# Patient Record
Sex: Male | Born: 1965 | Race: White | Hispanic: No | Marital: Married | Smoking: Never smoker
Health system: Southern US, Community
[De-identification: ages and names within clinical notes are randomized; demographics above are authoritative.]

## PROBLEM LIST (undated history)

## (undated) DIAGNOSIS — R51 Headache: Secondary | ICD-10-CM

## (undated) DIAGNOSIS — Z87442 Personal history of urinary calculi: Secondary | ICD-10-CM

## (undated) DIAGNOSIS — R519 Headache, unspecified: Secondary | ICD-10-CM

## (undated) DIAGNOSIS — G8929 Other chronic pain: Secondary | ICD-10-CM

## (undated) DIAGNOSIS — G4730 Sleep apnea, unspecified: Secondary | ICD-10-CM

## (undated) DIAGNOSIS — E7800 Pure hypercholesterolemia, unspecified: Secondary | ICD-10-CM

## (undated) DIAGNOSIS — I10 Essential (primary) hypertension: Secondary | ICD-10-CM

## (undated) DIAGNOSIS — E119 Type 2 diabetes mellitus without complications: Secondary | ICD-10-CM

## (undated) HISTORY — PX: WISDOM TOOTH EXTRACTION: SHX21

---

## 1997-10-11 ENCOUNTER — Emergency Department (HOSPITAL_COMMUNITY): Admission: EM | Admit: 1997-10-11 | Discharge: 1997-10-12 | Payer: Self-pay

## 1999-10-15 ENCOUNTER — Emergency Department (HOSPITAL_COMMUNITY): Admission: EM | Admit: 1999-10-15 | Discharge: 1999-10-15 | Payer: Self-pay | Admitting: Emergency Medicine

## 1999-10-15 ENCOUNTER — Encounter: Payer: Self-pay | Admitting: Emergency Medicine

## 2000-09-20 ENCOUNTER — Encounter: Admission: RE | Admit: 2000-09-20 | Discharge: 2000-09-20 | Payer: Self-pay | Admitting: Internal Medicine

## 2000-09-20 ENCOUNTER — Encounter: Payer: Self-pay | Admitting: Internal Medicine

## 2002-10-02 ENCOUNTER — Encounter: Admission: RE | Admit: 2002-10-02 | Discharge: 2002-10-02 | Payer: Self-pay | Admitting: Internal Medicine

## 2002-10-02 ENCOUNTER — Encounter: Payer: Self-pay | Admitting: Internal Medicine

## 2004-01-14 ENCOUNTER — Emergency Department (HOSPITAL_COMMUNITY): Admission: EM | Admit: 2004-01-14 | Discharge: 2004-01-14 | Payer: Self-pay | Admitting: Emergency Medicine

## 2004-05-08 ENCOUNTER — Encounter: Admission: RE | Admit: 2004-05-08 | Discharge: 2004-05-08 | Payer: Self-pay | Admitting: Orthopedic Surgery

## 2005-02-09 ENCOUNTER — Encounter: Admission: RE | Admit: 2005-02-09 | Discharge: 2005-02-09 | Payer: Self-pay | Admitting: Internal Medicine

## 2005-03-01 ENCOUNTER — Encounter: Admission: RE | Admit: 2005-03-01 | Discharge: 2005-03-01 | Payer: Self-pay | Admitting: Gastroenterology

## 2010-05-06 ENCOUNTER — Encounter: Payer: Self-pay | Admitting: Gastroenterology

## 2013-05-08 ENCOUNTER — Ambulatory Visit: Payer: Self-pay | Admitting: Interventional Cardiology

## 2013-05-20 ENCOUNTER — Ambulatory Visit: Payer: Self-pay | Admitting: Interventional Cardiology

## 2015-07-25 ENCOUNTER — Ambulatory Visit (INDEPENDENT_AMBULATORY_CARE_PROVIDER_SITE_OTHER): Payer: Managed Care, Other (non HMO) | Admitting: Podiatry

## 2015-07-25 ENCOUNTER — Encounter: Payer: Self-pay | Admitting: Podiatry

## 2015-07-25 VITALS — BP 160/91 | HR 67 | Temp 98.7°F | Resp 18

## 2015-07-25 DIAGNOSIS — L03115 Cellulitis of right lower limb: Secondary | ICD-10-CM

## 2015-07-25 DIAGNOSIS — R238 Other skin changes: Secondary | ICD-10-CM | POA: Insufficient documentation

## 2015-07-25 DIAGNOSIS — L988 Other specified disorders of the skin and subcutaneous tissue: Secondary | ICD-10-CM | POA: Diagnosis not present

## 2015-07-25 NOTE — Progress Notes (Signed)
   Subjective:    Patient ID: Nathaniel Conley, male    DOB: July 18, 1965, 50 y.o.   MRN: [id number]  HPI  50 year old male presents the also concerns of the blister to the bottom of his right foot on the ball of the big toe. He states the last Wednesday he started to have the blisters was not feeling well any but his primary care doctor on Friday he was given Augmentin. He states that yesterday blister did pop and had some bloody drainage In the area. Since that blister pop the pain has decreased. He states that this small amount of redness directly around the blister but this has improved since starting antibodies. Denies any red streaks. No other complaints at this time. He is diabetic and states his A1c went between an 8-9.   Review of Systems  All other systems reviewed and are negative.      Objective:   Physical Exam General: AAO x3, NAD  Dermatological: On the plantar aspect of the right foot submetatarsal one extending to the first interspace is a flaccid bulla with currently no fluid within it. Upon debridement superficial granular wound present. There is a faint rim of erythema around the area without any ascending cellulitis. There is no drainage or pus. No fluctuance or crepitus. No malodor. No other open lesions or pre-ulcerative lesions identified bilaterally.  Vascular: Dorsalis Pedis artery and Posterior Tibial artery pedal pulses are 2/4 bilateral with immedate capillary fill time. Pedal hair growth present. No varicosities and no lower extremity edema present bilateral. There is no pain with calf compression, swelling, warmth, erythema.   Neruologic: Grossly intact via light touch bilateral. Vibratory intact via tuning fork bilateral. Protective threshold with Semmes Wienstein monofilament intact to all pedal sites bilateral. Patellar and Achilles deep tendon reflexes 2+ bilateral. No Babinski or clonus noted bilateral.   Musculoskeletal: No gross boney pedal deformities  bilateral. No pain, crepitus, or limitation noted with foot and ankle range of motion bilateral. Muscular strength 5/5 in all groups tested bilateral.  Gait: Unassisted, Nonantalgic.       Assessment & Plan:  50 year old male right foot bulla with apparently resolving cellulitis -Treatment options discussed including all alternatives, risks, and complications -Etiology of symptoms were discussed -I went ahead and debrided some of the epidermolysis to reveal underlying greater wound. Recommended antibiotic  ointment and nonadherent dressing daily. -Finish course of Augmentin. -Will hold off on blood work as the infection appears resolving. -Monitor for any clinical signs or symptoms of infection and directed to call the office immediately should any occur or go to the ER. -Follow-up in 1 week or sooner if any problems arise. In the meantime, encouraged to call the office with any questions, concerns, change in symptoms.   Ovid Curd, DPM

## 2015-08-01 ENCOUNTER — Ambulatory Visit (INDEPENDENT_AMBULATORY_CARE_PROVIDER_SITE_OTHER): Payer: Managed Care, Other (non HMO) | Admitting: Podiatry

## 2015-08-01 DIAGNOSIS — L03115 Cellulitis of right lower limb: Secondary | ICD-10-CM | POA: Diagnosis not present

## 2015-08-01 DIAGNOSIS — L988 Other specified disorders of the skin and subcutaneous tissue: Secondary | ICD-10-CM | POA: Diagnosis not present

## 2015-08-01 DIAGNOSIS — R238 Other skin changes: Secondary | ICD-10-CM

## 2015-08-02 NOTE — Progress Notes (Signed)
Subjective:     Patient ID: Nathaniel Conley, male   DOB: 1965-11-25, 50 y.o.   MRN: [id number]  HPI patient states I'm doing well with my right foot with the site healing well with no blistering noted currently and no drainage noted by patient   Review of Systems     Objective:   Physical Exam Neurovascular status unchanged with muscle strength adequate and patient found to have discoloration around the right first metatarsal but no active drainage or breakdown of tissue    Assessment:     Blistering which is localized and is no longer active    Plan:     Advised on physical therapy supportive shoe gear and soaks and if any drainage were to occur to reappoint but it appears that this will heal uneventfully

## 2016-02-09 ENCOUNTER — Other Ambulatory Visit: Payer: Self-pay | Admitting: Internal Medicine

## 2016-02-09 DIAGNOSIS — R51 Headache: Principal | ICD-10-CM

## 2016-02-09 DIAGNOSIS — R519 Headache, unspecified: Secondary | ICD-10-CM

## 2016-02-19 ENCOUNTER — Ambulatory Visit
Admission: RE | Admit: 2016-02-19 | Discharge: 2016-02-19 | Disposition: A | Discharge status: Home / Self Care | Payer: Managed Care, Other (non HMO) | Source: Ambulatory Visit | Attending: Internal Medicine | Admitting: Internal Medicine

## 2016-02-19 DIAGNOSIS — R519 Headache, unspecified: Secondary | ICD-10-CM

## 2016-02-19 DIAGNOSIS — R51 Headache: Principal | ICD-10-CM

## 2016-02-19 MED ORDER — GADOBENATE DIMEGLUMINE 529 MG/ML IV SOLN
20.0000 mL | Freq: Once | INTRAVENOUS | Status: AC | PRN
  Administered 2016-02-19: 20 mL via INTRAVENOUS

## 2017-04-12 IMAGING — MR MR HEAD WO/W CM
10 of 12 series · 36 of 48 positions shown · IV contrast (20ml Multihance)
Comparison: None.

CLINICAL DATA: Constant headaches for 3 months.  Balance issue.

Creatinine was obtained on site at [HOSPITAL] at [HOSPITAL].
Results: Creatinine 1.0 mg/dL.
EXAM:
MRI HEAD WITHOUT AND WITH CONTRAST
TECHNIQUE: Multiplanar, multiecho pulse sequences of the brain and surrounding
structures were obtained without and with intravenous contrast.
CONTRAST:  20mL MULTIHANCE GADOBENATE DIMEGLUMINE 529 MG/ML IV SOLN

[Series 2: T1 · sagittal · 5.0mm · 0.45mm/px · 2 of 19 slices shown]
[im 1/19]
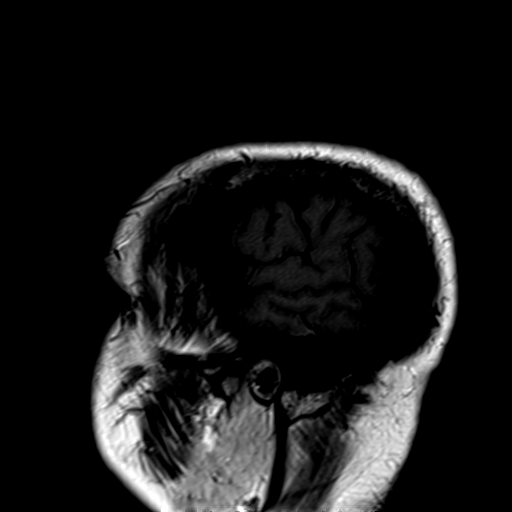
[im 19/19]
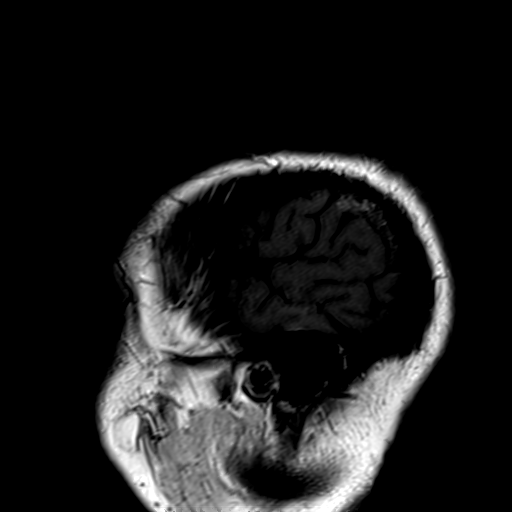

[Series 3: T2 · axial · 5.0mm · 0.30mm/px · z∈[-57,+86]mm · 2 of 24 slices shown (1 of 2)]
[im 1/24]
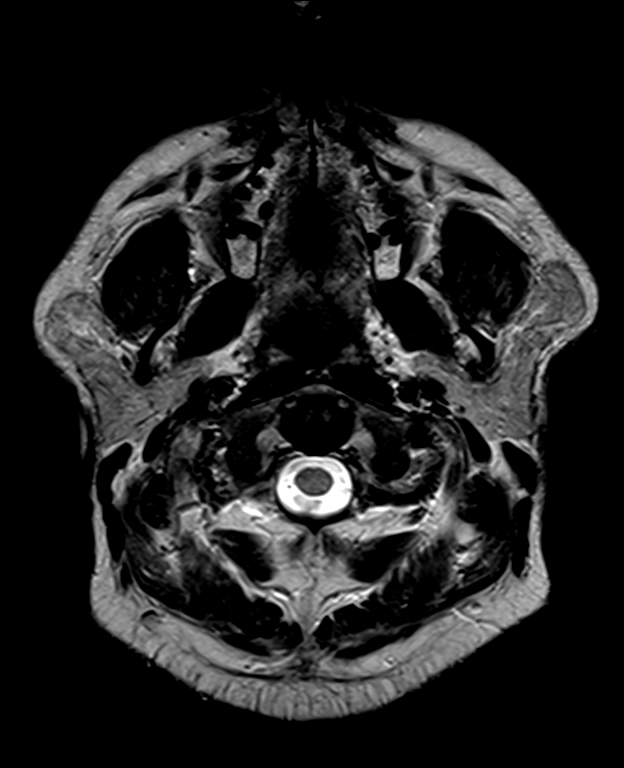
[im 24/24]
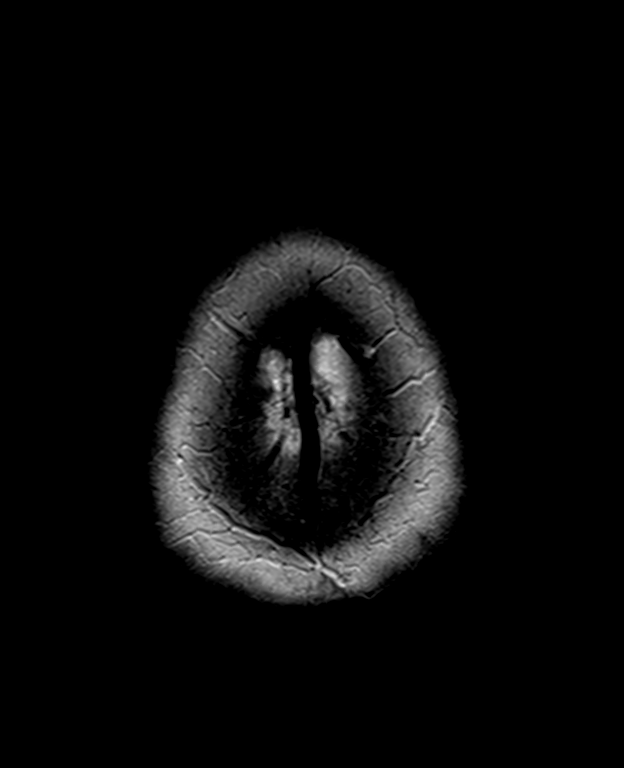

[Series 4: DWI · axial · 3.0mm · 1.80mm/px · z∈[-52,+83]mm · 8 of 95 slices shown (1 of 4)]
[im 1/95]
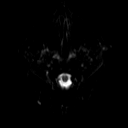
[im 14/95]
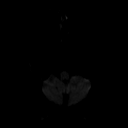
[im 27/95]
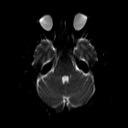
[im 41/95]
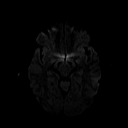
[im 54/95]
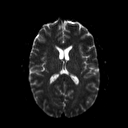
[im 68/95]
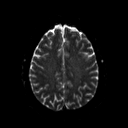
[im 81/95]
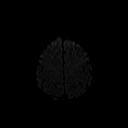
[im 95/95]
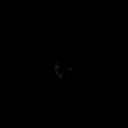

[Series 5: DWI · axial · 3.0mm · 1.80mm/px · z∈[-52,+83]mm · 4 of 48 slices shown (2 of 4)]
[im 1/48]
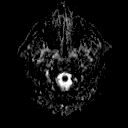
[im 16/48]
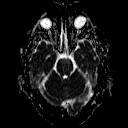
[im 32/48]
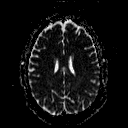
[im 48/48]
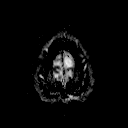

[Series 6: DWI · coronal · 5.0mm · 1.80mm/px · 5 of 68 slices shown (3 of 4)]
[im 1/68]
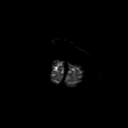
[im 17/68]
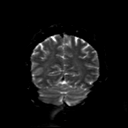
[im 34/68]
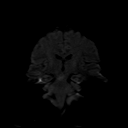
[im 51/68]
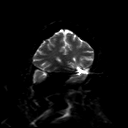
[im 68/68]
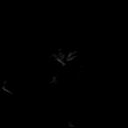

[Series 7: DWI · coronal · 5.0mm · 1.80mm/px · 3 of 34 slices shown (4 of 4)]
[im 1/34]
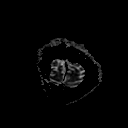
[im 17/34]
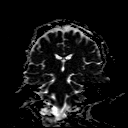
[im 34/34]
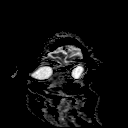

[Series 8: FLAIR · axial · 5.0mm · 0.45mm/px · z∈[-57,+86]mm · 2 of 24 slices shown]
[im 1/24]
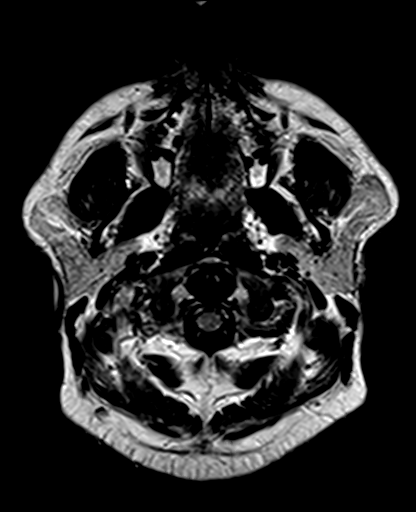
[im 24/24]
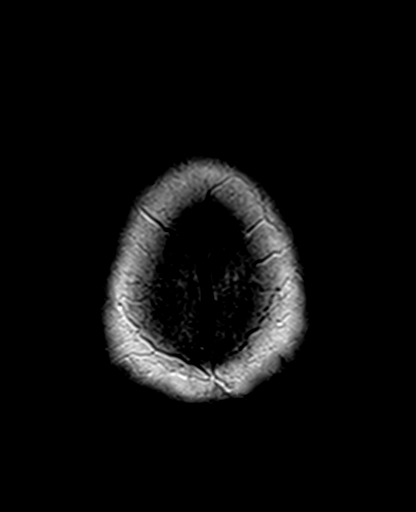

[Series 11: swi_images · axial · 2.0mm · 0.90mm/px · z∈[-62,+90]mm · 6 of 80 slices shown]
[im 1/80]
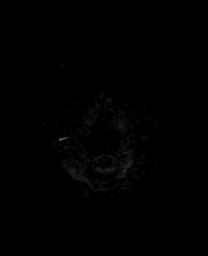
[im 16/80]
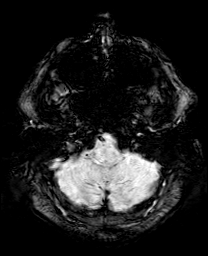
[im 32/80]
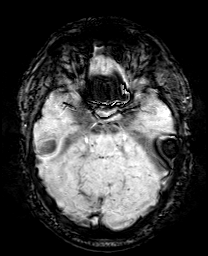
[im 48/80]
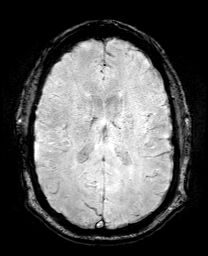
[im 64/80]
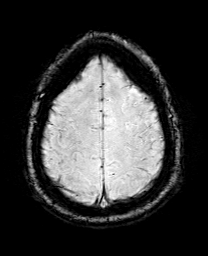
[im 80/80]
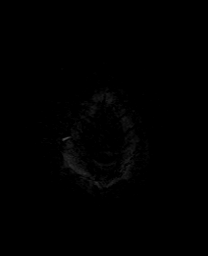

[Series 12: T2 · coronal · 5.0mm · 0.45mm/px · 2 of 24 slices shown (2 of 2)]
[im 1/24]
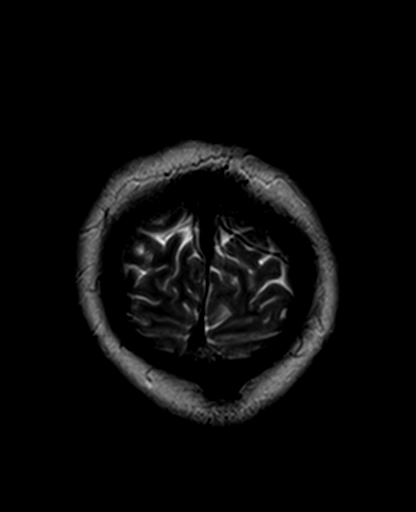
[im 24/24]
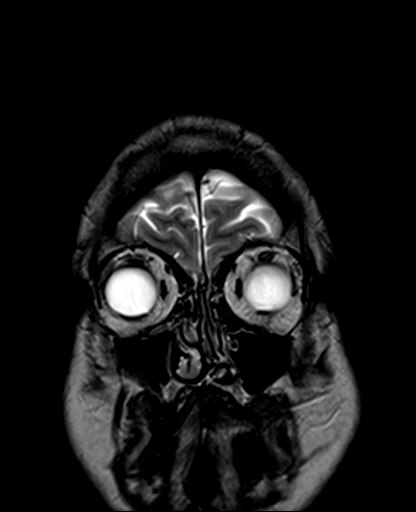

[Series 14: T1 post-contrast · coronal · 5.0mm · 0.43mm/px · 2 of 24 slices shown]
[im 1/24]
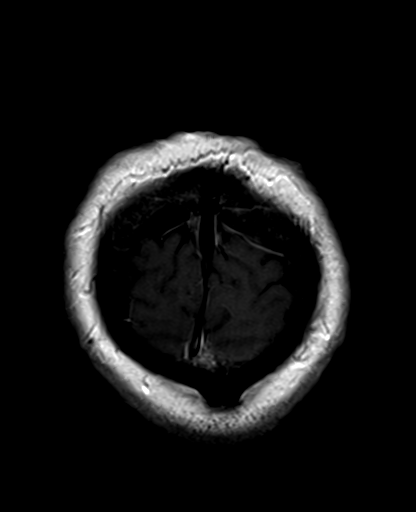
[im 24/24]
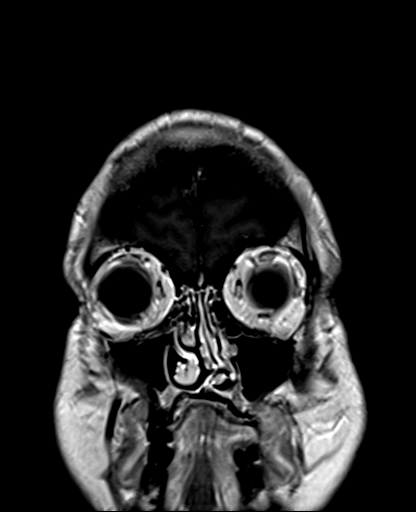

[36 of 48 positions shown; findings below may reference images not displayed]

FINDINGS: Brain: There is no evidence of acute infarct, intracranial
hemorrhage, mass, midline shift, or extra-axial fluid collection.
The ventricles and sulci are normal. The brain is normal in signal.
No abnormal enhancement is identified.

Vascular: Major intracranial vascular flow voids are preserved, with
the left vertebral artery being dominant.

Skull and upper cervical spine: Unremarkable bone marrow signal.

Sinuses/Orbits: Unremarkable orbits. No significant inflammatory
sinus disease.

Other: None.
IMPRESSION: Unremarkable brain MRI.

## 2018-01-21 ENCOUNTER — Ambulatory Visit
Admission: RE | Admit: 2018-01-21 | Discharge: 2018-01-21 | Disposition: A | Discharge status: Home / Self Care | Payer: Managed Care, Other (non HMO) | Source: Ambulatory Visit | Attending: Internal Medicine | Admitting: Internal Medicine

## 2018-01-21 ENCOUNTER — Other Ambulatory Visit: Payer: Self-pay | Admitting: Internal Medicine

## 2018-01-21 DIAGNOSIS — R1031 Right lower quadrant pain: Secondary | ICD-10-CM

## 2018-01-23 ENCOUNTER — Encounter (HOSPITAL_COMMUNITY): Payer: Self-pay | Admitting: General Practice

## 2018-01-24 ENCOUNTER — Other Ambulatory Visit: Payer: Self-pay | Admitting: Urology

## 2018-01-26 NOTE — Discharge Instructions (Signed)
Lithotripsy, Care After °This sheet gives you information about how to care for yourself after your procedure. Your health care provider may also give you more specific instructions. If you have problems or questions, contact your health care provider. °What can I expect after the procedure? °After the procedure, it is common to have: °· Some blood in your urine. This should only last for a few days. °· Soreness in your back, sides, or upper abdomen for a few days. °· Blotches or bruises on your back where the pressure wave entered the skin. °· Pain, discomfort, or nausea when pieces (fragments) of the kidney stone move through the tube that carries urine from the kidney to the bladder (ureter). Stone fragments may pass soon after the procedure, but they may continue to pass for up to 4-8 weeks. °? If you have severe pain or nausea, contact your health care provider. This may be caused by a large stone that was not broken up, and this may mean that you need more treatment. °· Some pain or discomfort during urination. °· Some pain or discomfort in the lower abdomen or (in men) at the base of the penis. ° °Follow these instructions at home: °Medicines °· Take over-the-counter and prescription medicines only as told by your health care provider. °· If you were prescribed an antibiotic medicine, take it as told by your health care provider. Do not stop taking the antibiotic even if you start to feel better. °· Do not drive for 24 hours if you were given a medicine to help you relax (sedative). °· Do not drive or use heavy machinery while taking prescription pain medicine. °Eating and drinking °· Drink enough water and fluids to keep your urine clear or pale yellow. This helps any remaining pieces of the stone to pass. It can also help prevent new stones from forming. °· Eat plenty of fresh fruits and vegetables. °· Follow instructions from your health care provider about eating and drinking restrictions. You may be  instructed: °? To reduce how much salt (sodium) you eat or drink. Check ingredients and nutrition facts on packaged foods and beverages. °? To reduce how much meat you eat. °· Eat the recommended amount of calcium for your age and gender. Ask your health care provider how much calcium you should have. °General instructions °· Get plenty of rest. °· Most people can resume normal activities 1-2 days after the procedure. Ask your health care provider what activities are safe for you. °· If directed, strain all urine through the strainer that was provided by your health care provider. °? Keep all fragments for your health care provider to see. Any stones that are found may be sent to a medical lab for examination. The stone may be as small as a grain of salt. °· Keep all follow-up visits as told by your health care provider. This is important. °Contact a health care provider if: °· You have pain that is severe or does not get better with medicine. °· You have nausea that is severe or does not go away. °· You have blood in your urine longer than your health care provider told you to expect. °· You have more blood in your urine. °· You have pain during urination that does not go away. °· You urinate more frequently than usual and this does not go away. °· You develop a rash or any other possible signs of an allergic reaction. °Get help right away if: °· You have severe pain in   your back, sides, or upper abdomen. °· You have severe pain while urinating. °· Your urine is very dark red. °· You have blood in your stool (feces). °· You cannot pass any urine at all. °· You feel a strong urge to urinate after emptying your bladder. °· You have a fever or chills. °· You develop shortness of breath, difficulty breathing, or chest pain. °· You have severe nausea that leads to persistent vomiting. °· You faint. °Summary °· After this procedure, it is common to have some pain, discomfort, or nausea when pieces (fragments) of the  kidney stone move through the tube that carries urine from the kidney to the bladder (ureter). If this pain or nausea is severe, however, you should contact your health care provider. °· Most people can resume normal activities 1-2 days after the procedure. Ask your health care provider what activities are safe for you. °· Drink enough water and fluids to keep your urine clear or pale yellow. This helps any remaining pieces of the stone to pass, and it can help prevent new stones from forming. °· If directed, strain your urine and keep all fragments for your health care provider to see. Fragments or stones may be as small as a grain of salt. °· Get help right away if you have severe pain in your back, sides, or upper abdomen or have severe pain while urinating. °This information is not intended to replace advice given to you by your health care provider. Make sure you discuss any questions you have with your health care provider. °Document Released: 04/22/2007 Document Revised: 02/22/2016 Document Reviewed: 02/22/2016 °Elsevier Interactive Patient Education © 2018 [employer] ° °

## 2018-01-26 NOTE — H&P (Signed)
HPI: Nathaniel Conley is a 52 year-old male with a right distal ureteral stone.  The problem is on the right side. He first stated noticing pain on approximately 01/19/2018. This is his first kidney stone. He is currently having flank pain and back pain. He denies having groin pain, nausea, vomiting, fever, and chills. He has not caught a stone in his urine strainer since his symptoms began.   He has never had surgical treatment for calculi in the past.   Patient was seen yesterday by Dr. Valentina Lucks for right-sided flank pain. He underwent a CT of the abdomen and pelvis that revealed a 7 mm mid to distal right ureteral calculus. He denies any fever or chill. He was given Toradol yesterday and his pain improved. Pain is currently controlled. No other complaints today.     ALLERGIES: None   MEDICATIONS: Lisinopril  Metformin Hcl  Atorvastatin Calcium  Glimepiride     GU PSH: None   NON-GU PSH: None   GU PMH: Renal calculus    NON-GU PMH: Diabetes Type 2 GERD Gout Hypercholesterolemia Hypertension Sleep Apnea    FAMILY HISTORY: 2 daughters - Other Colon Cancer - Father stroke - Mother   SOCIAL HISTORY: Marital Status: Married Preferred Language: English; Race: White Current Smoking Status: Patient has never smoked.   Tobacco Use Assessment Completed: Used Tobacco in last 30 days? Drinks 2 caffeinated drinks per day.    REVIEW OF SYSTEMS:    GU Review Male:   Patient denies frequent urination, hard to postpone urination, burning/ pain with urination, get up at night to urinate, leakage of urine, stream starts and stops, trouble starting your stream, have to strain to urinate , erection problems, and penile pain.  Gastrointestinal (Upper):   Patient denies nausea, vomiting, and indigestion/ heartburn.  Gastrointestinal (Lower):   Patient denies diarrhea and constipation.  Constitutional:   Patient reports weight loss. Patient denies fever, night sweats, and fatigue.  Skin:    Patient denies skin rash/ lesion and itching.  Eyes:   Patient denies blurred vision and double vision.  Ears/ Nose/ Throat:   Patient denies sore throat and sinus problems.  Hematologic/Lymphatic:   Patient denies swollen glands and easy bruising.  Cardiovascular:   Patient denies leg swelling and chest pains.  Respiratory:   Patient denies cough and shortness of breath.  Endocrine:   Patient denies excessive thirst.  Musculoskeletal:   Patient denies back pain and joint pain.  Neurological:   Patient reports headaches. Patient denies dizziness.  Psychologic:   Patient denies depression and anxiety.   VITAL SIGNS:    Weight 209 lb / 94.8 kg  Height 69 in / 175.26 cm  BP 146/83 mmHg  Heart Rate 63 /min  Temperature 99.0 F / 37.2 C  BMI 30.9 kg/m   MULTI-SYSTEM PHYSICAL EXAMINATION:    Constitutional: Well-nourished. No physical deformities. Normally developed. Good grooming.  Respiratory: No labored breathing, no use of accessory muscles.   Cardiovascular: Normal temperature, adequate perfusion of extremities  Skin: No paleness, no jaundice  Neurologic / Psychiatric: Oriented to time, oriented to place, oriented to person. No depression, no anxiety, no agitation.  Gastrointestinal: No mass, no tenderness, no rigidity, non obese abdomen.  Eyes: Normal conjunctivae. Normal eyelids.  Musculoskeletal: Normal gait and station of head and neck.     PAST DATA REVIEWED:  Source Of History:  Patient  X-Ray Review: C.T. Abdomen/Pelvis: Reviewed Films. Reviewed Report. Discussed With Patient.     PROCEDURES:  KUB - [id number]  A single view of the abdomen is obtained.  7 mm distal ureteral calculus redemonstrated               Urinalysis w/Scope Dipstick Dipstick Cont'd Micro  Color: Yellow Bilirubin: Neg mg/dL WBC/hpf: 10 - 54/UJW  Appearance: Clear Ketones: Neg mg/dL RBC/hpf: 0 - 2/hpf  Specific Gravity: 1.025 Blood: Trace ery/uL Bacteria: NS (Not Seen)  pH: <=5.0 Protein:  Trace mg/dL Cystals: NS (Not Seen)  Glucose: Neg mg/dL Urobilinogen: 0.2 mg/dL Casts: Hyaline    Nitrites: Neg Trichomonas: Not Present    Leukocyte Esterase: Neg leu/uL Mucous: Present      Epithelial Cells: NS (Not Seen)      Yeast: NS (Not Seen)      Sperm: Not Present       Medications New Meds: Tamsulosin Hcl 0.4 mg capsule 1 capsule PO Daily   #14  1 Refill(s)  Hydrocodone-Acetaminophen 5 mg-325 mg tablet 1 tablet PO Q 6 H PRN For pain  #10  0 Refill(s)  ASSESSMENT:      ICD-10 Details  1 GU:   Ureteral calculus - N20.1   2   Ureteral obstruction secondary to calculous - N13.2    PLAN: We discussed the management of urinary stones. These options include observation, ureteroscopy, and shockwave lithotripsy. We discussed which options are relevant to these particular stones. We discussed the natural history of stones as well as the complications of untreated stones and the impact on quality of life without treatment as well as with each of the above listed treatments. We also discussed the efficacy of each treatment in its ability to clear the stone burden. With any of these management options I discussed the signs and symptoms of infection and the need for emergent treatment should these be experienced. For each option we discussed the ability of each procedure to clear the patient of their stone burden.   For observation I described the risks which include but are not limited to silent renal damage, life-threatening infection, need for emergent surgery, failure to pass stone, and pain.   For ureteroscopy I described the risks which include heart attack, stroke, pulmonary embolus, death, bleeding, infection, damage to contiguous structures, positioning injury, ureteral stricture, ureteral avulsion, ureteral injury, need for ureteral stent, inability to perform ureteroscopy, need for an interval procedure, inability to clear stone burden, stent discomfort and pain.   For shockwave  lithotripsy I described the risks which include arrhythmia, kidney contusion, kidney hemorrhage, need for transfusion, pain, inability to break up stone, inability to pass stone fragments, Steinstrasse, infection associated with obstructing stones, need for different surgical procedure, need for repeat shockwave lithotripsy.   Patient would like to proceed with ESWL. He understands that there is potential for failure especially in its current position but would like to try this as a first line treatment.

## 2018-01-27 ENCOUNTER — Encounter (HOSPITAL_COMMUNITY): Payer: Self-pay | Admitting: *Deleted

## 2018-01-27 ENCOUNTER — Ambulatory Visit (HOSPITAL_COMMUNITY): Payer: Managed Care, Other (non HMO)

## 2018-01-27 ENCOUNTER — Ambulatory Visit (HOSPITAL_COMMUNITY)
Admission: RE | Admit: 2018-01-27 | Discharge: 2018-01-27 | Disposition: A | Discharge status: Home / Self Care | Payer: Managed Care, Other (non HMO) | Source: Ambulatory Visit | Attending: Urology | Admitting: Urology

## 2018-01-27 ENCOUNTER — Other Ambulatory Visit: Payer: Self-pay

## 2018-01-27 ENCOUNTER — Encounter (HOSPITAL_COMMUNITY)
Admission: RE | Disposition: A | Discharge status: Home / Self Care | Payer: Self-pay | Source: Ambulatory Visit | Attending: Urology

## 2018-01-27 DIAGNOSIS — E7800 Pure hypercholesterolemia, unspecified: Secondary | ICD-10-CM | POA: Diagnosis not present

## 2018-01-27 DIAGNOSIS — G4733 Obstructive sleep apnea (adult) (pediatric): Secondary | ICD-10-CM | POA: Insufficient documentation

## 2018-01-27 DIAGNOSIS — K219 Gastro-esophageal reflux disease without esophagitis: Secondary | ICD-10-CM | POA: Diagnosis not present

## 2018-01-27 DIAGNOSIS — E119 Type 2 diabetes mellitus without complications: Secondary | ICD-10-CM | POA: Insufficient documentation

## 2018-01-27 DIAGNOSIS — Z79899 Other long term (current) drug therapy: Secondary | ICD-10-CM | POA: Insufficient documentation

## 2018-01-27 DIAGNOSIS — I10 Essential (primary) hypertension: Secondary | ICD-10-CM | POA: Diagnosis not present

## 2018-01-27 DIAGNOSIS — N201 Calculus of ureter: Secondary | ICD-10-CM | POA: Diagnosis not present

## 2018-01-27 DIAGNOSIS — M109 Gout, unspecified: Secondary | ICD-10-CM | POA: Diagnosis not present

## 2018-01-27 DIAGNOSIS — N135 Crossing vessel and stricture of ureter without hydronephrosis: Secondary | ICD-10-CM | POA: Diagnosis not present

## 2018-01-27 DIAGNOSIS — Z7984 Long term (current) use of oral hypoglycemic drugs: Secondary | ICD-10-CM | POA: Insufficient documentation

## 2018-01-27 HISTORY — DX: Headache, unspecified: R51.9

## 2018-01-27 HISTORY — DX: Essential (primary) hypertension: I10

## 2018-01-27 HISTORY — DX: Headache: R51

## 2018-01-27 HISTORY — DX: Other chronic pain: G89.29

## 2018-01-27 HISTORY — DX: Pure hypercholesterolemia, unspecified: E78.00

## 2018-01-27 HISTORY — PX: EXTRACORPOREAL SHOCK WAVE LITHOTRIPSY: SHX1557

## 2018-01-27 HISTORY — DX: Sleep apnea, unspecified: G47.30

## 2018-01-27 HISTORY — DX: Personal history of urinary calculi: Z87.442

## 2018-01-27 HISTORY — DX: Type 2 diabetes mellitus without complications: E11.9

## 2018-01-27 LAB — GLUCOSE, CAPILLARY: Glucose-Capillary: 107 mg/dL — ABNORMAL HIGH (ref 70–99)

## 2018-01-27 SURGERY — LITHOTRIPSY, ESWL
Anesthesia: LOCAL | Laterality: Right

## 2018-01-27 MED ORDER — TAMSULOSIN HCL 0.4 MG PO CAPS: 0.4000 mg | ORAL_CAPSULE | ORAL | 0 refills | Status: DC

## 2018-01-27 MED ORDER — DIAZEPAM 5 MG PO TABS
10.0000 mg | ORAL_TABLET | ORAL | Status: AC
  Administered 2018-01-27: 10 mg via ORAL
  Filled 2018-01-27: qty 2

## 2018-01-27 MED ORDER — OXYCODONE HCL 10 MG PO TABS: 10.0000 mg | ORAL_TABLET | ORAL | 0 refills | Status: DC | PRN

## 2018-01-27 MED ORDER — DIPHENHYDRAMINE HCL 25 MG PO CAPS
25.0000 mg | ORAL_CAPSULE | ORAL | Status: AC
  Administered 2018-01-27: 25 mg via ORAL
  Filled 2018-01-27: qty 1

## 2018-01-27 MED ORDER — CIPROFLOXACIN HCL 500 MG PO TABS
500.0000 mg | ORAL_TABLET | ORAL | Status: AC
  Administered 2018-01-27: 500 mg via ORAL
  Filled 2018-01-27: qty 1

## 2018-01-27 MED ORDER — SODIUM CHLORIDE 0.9 % IV SOLN
INTRAVENOUS | Status: DC
  Administered 2018-01-27: 14:00:00 via INTRAVENOUS

## 2018-01-27 NOTE — Progress Notes (Signed)
Patient informed of procedure delay (litho truck has not arrived). Procedure to scheduled for 1530. Stated he will notify his wife. Offered to let patient leave and come back. Nathaniel Conley opted to stay.

## 2018-01-27 NOTE — Op Note (Signed)
See [facility] OP note scanned into chart. Also because of the size, density, location and other factors that cannot be anticipated I feel this will likely be a staged procedure. This fact supersedes any indication in the scanned [facility] operative note to the contrary.  

## 2018-01-28 ENCOUNTER — Encounter (HOSPITAL_COMMUNITY): Payer: Self-pay | Admitting: Urology

## 2020-05-25 ENCOUNTER — Other Ambulatory Visit: Payer: Self-pay | Admitting: Internal Medicine

## 2020-05-25 DIAGNOSIS — Z8249 Family history of ischemic heart disease and other diseases of the circulatory system: Secondary | ICD-10-CM

## 2020-06-17 ENCOUNTER — Other Ambulatory Visit: Payer: Self-pay

## 2020-06-17 ENCOUNTER — Ambulatory Visit
Admission: RE | Admit: 2020-06-17 | Discharge: 2020-06-17 | Disposition: A | Discharge status: Home / Self Care | Payer: No Typology Code available for payment source | Source: Ambulatory Visit | Attending: Internal Medicine | Admitting: Internal Medicine

## 2020-06-17 DIAGNOSIS — Z8249 Family history of ischemic heart disease and other diseases of the circulatory system: Secondary | ICD-10-CM

## 2020-07-04 NOTE — Progress Notes (Signed)
Cardiology Office Note:    Date:  07/06/2020   ID:  Nathaniel Conley, DOB 02-Sep-1965, MRN [id number]  PCP:  Kirby Funk, MD  Cardiologist:  No primary care provider on file.  Electrophysiologist:  None   Referring MD: Kirby Funk, MD   Chief Complaint  Patient presents with  . Coronary Artery Disease    History of Present Illness:    Nathaniel Conley is a 55 y.o. male with a hx of T2DM, hyperlipidemia, hypertension, OSA who is referred by Dr. Valentina Lucks for evaluation of CAD.  Calcium score on 06/17/2020 was 963 (98th percentile).  Also noted to have dilated ascending aorta measuring 40 mm.  He does report occasional chest pain.  Describes as dull aching pain in center of his chest, occurs when he is under significant stress.  Occurs about once per month.  Has not noted any exertional chest pain.  He denies any dyspnea.  Reports chest pain typically last for 5 to 10 minutes.  Has had some lightheadedness recently with dietary changes, denies any syncope.  Has chronic headaches.  He denies any palpitations or lower extremity edema.  Does not exercise, has been limited by hip pain.  No smoking history.  Family history includes paternal grandfather died of MI at age 57 and father had MI at age 37.  Past Medical History:  Diagnosis Date  . Chronic headaches    " 24-7" headache Pt. stated.  . Diabetes mellitus without complication (HCC)   . High cholesterol   . History of kidney stones   . Hypertension   . Sleep apnea    uses bipap    Past Surgical History:  Procedure Laterality Date  . EXTRACORPOREAL SHOCK WAVE LITHOTRIPSY Right 01/27/2018   Procedure: RIGHT EXTRACORPOREAL SHOCK WAVE LITHOTRIPSY (ESWL);  Surgeon: Ihor Gully, MD;  Location: [facility];  Service: Urology;  Laterality: Right;  . WISDOM TOOTH EXTRACTION     age 5-17    Current Medications: Current Meds  Medication Sig  . aspirin 81 MG chewable tablet Chew 81 mg by mouth daily.  Marland Kitchen atorvastatin (LIPITOR) 80 MG tablet  Take 80 mg by mouth daily.  . metFORMIN (GLUCOPHAGE) 1000 MG tablet   . nitroGLYCERIN (NITROSTAT) 0.4 MG SL tablet Place 1 tablet (0.4 mg total) under the tongue every 5 (five) minutes as needed.  . TRULICITY 3 MG/0.5ML SOPN   . [DISCONTINUED] atorvastatin (LIPITOR) 20 MG tablet   . [DISCONTINUED] glimepiride (AMARYL) 2 MG tablet   . [DISCONTINUED] HYDROcodone-acetaminophen (NORCO/VICODIN) 5-325 MG tablet Take 1-2 tablets by mouth every 6 (six) hours as needed for moderate pain.  . [DISCONTINUED] lisinopril (PRINIVIL,ZESTRIL) 10 MG tablet   . [DISCONTINUED] Oxycodone HCl 10 MG TABS Take 1 tablet (10 mg total) by mouth every 4 (four) hours as needed.  . [DISCONTINUED] tamsulosin (FLOMAX) 0.4 MG CAPS capsule Take 1 capsule (0.4 mg total) by mouth daily after supper.     Allergies:   Patient has no known allergies.   Social History   Socioeconomic History  . Marital status: Married    Spouse name: Not on file  . Number of children: Not on file  . Years of education: Not on file  . Highest education level: Not on file  Occupational History  . Not on file  Tobacco Use  . Smoking status: Never Smoker  . Smokeless tobacco: Never Used  Substance and Sexual Activity  . Alcohol use: No    Alcohol/week: 0.0 standard drinks  . Drug use:  No  . Sexual activity: Not on file  Other Topics Concern  . Not on file  Social History Narrative  . Not on file   Social Determinants of Health   Financial Resource Strain: Not on file  Food Insecurity: Not on file  Transportation Needs: Not on file  Physical Activity: Not on file  Stress: Not on file  Social Connections: Not on file     Family History: Paternal grandfather MI at 74, father MI at age 86  ROS:   Please see the history of present illness.     All other systems reviewed and are negative.  EKGs/Labs/Other Studies Reviewed:    The following studies were reviewed today:   EKG:  EKG is  ordered today.  The ekg ordered today  demonstrates normal sinus rhythm, PVC, rate 73, nonspecific T wave flattening  Recent Labs: No results found for requested labs within last 8760 hours.  Recent Lipid Panel No results found for: CHOL, TRIG, HDL, CHOLHDL, VLDL, LDLCALC, LDLDIRECT  Physical Exam:    VS:  BP (!) 152/78   Pulse 73   Ht 5\' 9"  (1.753 m)   Wt 246 lb 9.6 oz (111.9 kg)   SpO2 99%   BMI 36.42 kg/m     Wt Readings from Last 3 Encounters:  07/05/20 246 lb 9.6 oz (111.9 kg)  01/27/18 209 lb (94.8 kg)     GEN: Well nourished, well developed in no acute distress HEENT: Normal NECK: No JVD; No carotid bruits LYMPHATICS: No lymphadenopathy CARDIAC: RRR, no murmurs, rubs, gallops RESPIRATORY:  Clear to auscultation without rales, wheezing or rhonchi  ABDOMEN: Soft, non-tender, non-distended MUSCULOSKELETAL:  No edema; No deformity  SKIN: Warm and dry NEUROLOGIC:  Alert and oriented x 3 PSYCHIATRIC:  Normal affect   ASSESSMENT:    1. Chest pain of uncertain etiology   2. Coronary artery disease involving native coronary artery of native heart, unspecified whether angina present   3. Hyperlipidemia, unspecified hyperlipidemia type   4. Essential hypertension   5. Aortic dilatation (HCC)    PLAN:    CAD: Calcium score on 06/17/2020 was 963 (98th percentile).  Reports atypical chest pain -Lexiscan Myoview to evaluate for ischemia -Echocardiogram -Continue aspirin 81 mg daily -On atorvastatin 20 mg daily.  LDL 71 on 05/26/20.  Atorvastatin dose was increased to 80 mg daily -SL NTG as needed  Hyperlipidemia: On atorvastatin 20 mg daily,  LDL 71 on 05/26/20.  Goal LDL less than 70 given CAD as above.  Atorvastatin dose was increased to 80 mg daily  Hypertension: Lisinopril dose recently increased to 20 mg daily.  BP remains elevated, asked to check BP daily for next week and call with results  Aortic dilatation: Dilated ascending aorta measuring 40 mm on calcium score on 06/17/2020.  Will monitor with CTA  chest in 1 year  T2DM on Trulicity, metformin, glimepiride.  A!c 8.5% on 06/23/20  OSA: on Bipap  RTC in 3 months  Shared Decision Making/Informed Consent The risks [chest pain, shortness of breath, cardiac arrhythmias, dizziness, blood pressure fluctuations, myocardial infarction, stroke/transient ischemic attack, nausea, vomiting, allergic reaction, radiation exposure, metallic taste sensation and life-threatening complications (estimated to be 1 in 10,000)], benefits (risk stratification, diagnosing coronary artery disease, treatment guidance) and alternatives of a nuclear stress test were discussed in detail with Mr. Repass and he agrees to proceed.      Medication Adjustments/Labs and Tests Ordered: Current medicines are reviewed at length with the patient today.  Concerns  regarding medicines are outlined above.  Orders Placed This Encounter  Procedures  . MYOCARDIAL PERFUSION IMAGING  . EKG 12-Lead  . ECHOCARDIOGRAM COMPLETE   Meds ordered this encounter  Medications  . nitroGLYCERIN (NITROSTAT) 0.4 MG SL tablet    Sig: Place 1 tablet (0.4 mg total) under the tongue every 5 (five) minutes as needed.    Dispense:  25 tablet    Refill:  3    Patient Instructions  Medication Instructions:  Take sublingual nitroglycerin AS NEEDED for chest pain  *If you need a refill on your cardiac medications before your next appointment, please call your pharmacy*  Testing/Procedures: Your physician has requested that you have an echocardiogram. Echocardiography is a painless test that uses sound waves to create images of your heart. It provides your doctor with information about the size and shape of your heart and how well your heart's chambers and valves are working. This procedure takes approximately one hour. There are no restrictions for this procedure.  Your physician has requested that you have a lexiscan myoview. For further information please visit [email or web address] Please  follow instruction sheet, as given.   This will be done at our [facility] location:  [address]  How to prepare for your Myocardial Perfusion Test:  Do not eat or drink 3 hours prior to your test, except you may have water.  Do not consume products containing caffeine (regular or decaffeinated) 12 hours prior to your test. (ex: coffee, chocolate, sodas, tea).  Do bring a list of your current medications with you.  If not listed below, you may take your medications as normal.  Do wear comfortable clothes (no dresses or overalls) and walking shoes, tennis shoes preferred (No heels or open toe shoes are allowed).  Do NOT wear cologne, perfume, aftershave, or lotions (deodorant is allowed).  The test will take approximately 3 to 4 hours to complete  If these instructions are not followed, your test will have to be rescheduled.   Follow-Up: At [facility], you and your health needs are our priority.  As part of our continuing mission to provide you with exceptional heart care, we have created designated Provider Care Teams.  These Care Teams include your primary Cardiologist (physician) and Advanced Practice Providers (APPs -  Physician Assistants and Nurse Practitioners) who all work together to provide you with the care you need, when you need it.  We recommend signing up for the patient portal called "MyChart".  Sign up information is provided on this After Visit Summary.  MyChart is used to connect with patients for Virtual Visits (Telemedicine).  Patients are able to view lab/test results, encounter notes, upcoming appointments, etc.  Non-urgent messages can be sent to your provider as well.   To learn more about what you can do with MyChart, go to [email or web address].    Your next appointment:   2 month(s)  The format for your next appointment:   In Person  Provider:   Epifanio Lesches, MD   Other Instructions Recommend getting a blood pressure  cuff for home (Omron upper arm cuff).    Please check your blood pressure at home daily, write it down.  Call the office or send message via Mychart with the readings in 2 weeks for Dr. Bjorn Pippin to review.       Signed, Little Ishikawa, MD  07/06/2020 12:48 AM    [facility]

## 2020-07-05 ENCOUNTER — Encounter: Payer: Self-pay | Admitting: Cardiology

## 2020-07-05 ENCOUNTER — Other Ambulatory Visit: Payer: Self-pay

## 2020-07-05 ENCOUNTER — Ambulatory Visit (INDEPENDENT_AMBULATORY_CARE_PROVIDER_SITE_OTHER): Payer: Managed Care, Other (non HMO) | Admitting: Cardiology

## 2020-07-05 VITALS — BP 152/78 | HR 73 | Ht 69.0 in | Wt 246.6 lb

## 2020-07-05 DIAGNOSIS — I10 Essential (primary) hypertension: Secondary | ICD-10-CM

## 2020-07-05 DIAGNOSIS — E785 Hyperlipidemia, unspecified: Secondary | ICD-10-CM

## 2020-07-05 DIAGNOSIS — I2510 Atherosclerotic heart disease of native coronary artery without angina pectoris: Secondary | ICD-10-CM | POA: Diagnosis not present

## 2020-07-05 DIAGNOSIS — R079 Chest pain, unspecified: Secondary | ICD-10-CM | POA: Diagnosis not present

## 2020-07-05 DIAGNOSIS — I77819 Aortic ectasia, unspecified site: Secondary | ICD-10-CM

## 2020-07-05 MED ORDER — NITROGLYCERIN 0.4 MG SL SUBL: 0.4000 mg | SUBLINGUAL_TABLET | SUBLINGUAL | 3 refills | Status: DC | PRN

## 2020-07-05 NOTE — Patient Instructions (Addendum)
Medication Instructions:  Take sublingual nitroglycerin AS NEEDED for chest pain  *If you need a refill on your cardiac medications before your next appointment, please call your pharmacy*  Testing/Procedures: Your physician has requested that you have an echocardiogram. Echocardiography is a painless test that uses sound waves to create images of your heart. It provides your doctor with information about the size and shape of your heart and how well your heart's chambers and valves are working. This procedure takes approximately one hour. There are no restrictions for this procedure.  Your physician has requested that you have a lexiscan myoview. For further information please visit [email or web address] Please follow instruction sheet, as given.   This will be done at our [facility] location:  [address]  How to prepare for your Myocardial Perfusion Test:  Do not eat or drink 3 hours prior to your test, except you may have water.  Do not consume products containing caffeine (regular or decaffeinated) 12 hours prior to your test. (ex: coffee, chocolate, sodas, tea).  Do bring a list of your current medications with you.  If not listed below, you may take your medications as normal.  Do wear comfortable clothes (no dresses or overalls) and walking shoes, tennis shoes preferred (No heels or open toe shoes are allowed).  Do NOT wear cologne, perfume, aftershave, or lotions (deodorant is allowed).  The test will take approximately 3 to 4 hours to complete  If these instructions are not followed, your test will have to be rescheduled.   Follow-Up: At [facility], you and your health needs are our priority.  As part of our continuing mission to provide you with exceptional heart care, we have created designated Provider Care Teams.  These Care Teams include your primary Cardiologist (physician) and Advanced Practice Providers (APPs -  Physician Assistants and Nurse  Practitioners) who all work together to provide you with the care you need, when you need it.  We recommend signing up for the patient portal called "MyChart".  Sign up information is provided on this After Visit Summary.  MyChart is used to connect with patients for Virtual Visits (Telemedicine).  Patients are able to view lab/test results, encounter notes, upcoming appointments, etc.  Non-urgent messages can be sent to your provider as well.   To learn more about what you can do with MyChart, go to [email or web address].    Your next appointment:   2 month(s)  The format for your next appointment:   In Person  Provider:   Epifanio Lesches, MD   Other Instructions Recommend getting a blood pressure cuff for home (Omron upper arm cuff).    Please check your blood pressure at home daily, write it down.  Call the office or send message via Mychart with the readings in 2 weeks for Dr. Bjorn Pippin to review.

## 2020-07-12 ENCOUNTER — Telehealth (HOSPITAL_COMMUNITY): Payer: Self-pay | Admitting: *Deleted

## 2020-07-12 NOTE — Telephone Encounter (Signed)
Patient given detailed instructions per Myocardial Perfusion Study Information Sheet for the test on 07/15/20 at 10:45. Patient notified to arrive 15 minutes early and that it is imperative to arrive on time for appointment to keep from having the test rescheduled.  If you need to cancel or reschedule your appointment, please call the office within 24 hours of your appointment. . Patient verbalized understanding.Nathaniel Conley

## 2020-07-15 ENCOUNTER — Other Ambulatory Visit: Payer: Self-pay

## 2020-07-15 ENCOUNTER — Ambulatory Visit (HOSPITAL_COMMUNITY): Payer: Managed Care, Other (non HMO) | Attending: Internal Medicine

## 2020-07-15 DIAGNOSIS — R079 Chest pain, unspecified: Secondary | ICD-10-CM | POA: Diagnosis present

## 2020-07-15 LAB — MYOCARDIAL PERFUSION IMAGING
LV dias vol: 126 mL (ref 62–150)
LV sys vol: 68 mL
Peak HR: 101 {beats}/min
Rest HR: 70 {beats}/min
SDS: 0
SRS: 0
SSS: 0
TID: 0.91

## 2020-07-15 MED ORDER — REGADENOSON 0.4 MG/5ML IV SOLN
0.4000 mg | Freq: Once | INTRAVENOUS | Status: AC
  Administered 2020-07-15: 0.4 mg via INTRAVENOUS

## 2020-07-15 MED ORDER — TECHNETIUM TC 99M TETROFOSMIN IV KIT
9.3000 | PACK | Freq: Once | INTRAVENOUS | Status: AC | PRN
  Administered 2020-07-15: 9.3 via INTRAVENOUS
  Filled 2020-07-15: qty 10

## 2020-07-15 MED ORDER — TECHNETIUM TC 99M TETROFOSMIN IV KIT
31.3000 | PACK | Freq: Once | INTRAVENOUS | Status: AC | PRN
  Administered 2020-07-15: 31.3 via INTRAVENOUS
  Filled 2020-07-15: qty 32

## 2020-07-22 ENCOUNTER — Ambulatory Visit (HOSPITAL_COMMUNITY): Payer: Managed Care, Other (non HMO) | Attending: Cardiology

## 2020-07-22 ENCOUNTER — Other Ambulatory Visit: Payer: Self-pay

## 2020-07-22 DIAGNOSIS — R079 Chest pain, unspecified: Secondary | ICD-10-CM

## 2020-07-22 LAB — ECHOCARDIOGRAM COMPLETE
Area-P 1/2: 3.42 cm2
S' Lateral: 3.8 cm

## 2020-07-22 MED ORDER — PERFLUTREN LIPID MICROSPHERE
2.0000 mL | INTRAVENOUS | Status: AC | PRN
  Administered 2020-07-22: 2 mL via INTRAVENOUS

## 2020-07-26 ENCOUNTER — Telehealth: Payer: Self-pay | Admitting: Cardiology

## 2020-07-26 NOTE — Telephone Encounter (Signed)
Patient calling for echo results 

## 2020-07-26 NOTE — Telephone Encounter (Signed)
Spoke with pt on the phone and explained results of echo. All questions answered and pt verbalizes understanding.  Pt would like to know if he should keep his appointment in June to see Dr. Bjorn Pippin.

## 2020-07-31 NOTE — Telephone Encounter (Signed)
Would keep appointment to discuss results

## 2020-08-04 NOTE — Telephone Encounter (Signed)
Left message to keep upcoming appt (ok per DPR)

## 2020-09-15 NOTE — Progress Notes (Signed)
Cardiology Office Note:    Date:  09/16/2020   ID:  Nathaniel Conley, DOB [birth year], MRN [id number]  PCP:  Kirby Funk, MD  Cardiologist:  None  Electrophysiologist:  None   Referring MD: Kirby Funk, MD   Chief Complaint  Patient presents with  . Chest Pain    History of Present Illness:    Nathaniel Conley is a 55 y.o. male with a hx of T2DM, hyperlipidemia, hypertension, OSA who presents for follow-up.  He was referred by Dr. Valentina Lucks for evaluation of CAD, initially seen on 07/05/2020.  Calcium score on 06/17/2020 was 963 (98th percentile).  Also noted to have dilated ascending aorta measuring 40 mm.  He does report occasional chest pain.  Describes as dull aching pain in center of his chest, occurs when he is under significant stress.  Occurs about once per month.  Has not noted any exertional chest pain.  He denies any dyspnea.  Reports chest pain typically last for 5 to 10 minutes.  Has had some lightheadedness recently with dietary changes, denies any syncope.  Has chronic headaches.  He denies any palpitations or lower extremity edema.  Does not exercise, has been limited by hip pain.  No smoking history.  Family history includes paternal grandfather died of MI at age 73 and father had MI at age 29.  Lexiscan Myoview on 07/15/2020 showed probably normal perfusion with mild diaphragmatic attenuation, no evidence of ischemia, LVEF 46%.  Echocardiogram on 07/22/2020 showed prominent trabeculations, consider noncompaction, LVEF 50 to 55%, normal RV function, no significant valvular disease.  Since last clinic visit, he reports that he has been doing okay.  Denies any chest pain, dyspnea, lightheadedness, syncope, lower extremity edema, or palpitations.  Reports compliance with Bipap.  Has not been exercising due to hip pain.     Past Medical History:  Diagnosis Date  . Chronic headaches    " 24-7" headache Pt. stated.  . Diabetes mellitus without complication (HCC)   . High cholesterol    . History of kidney stones   . Hypertension   . Sleep apnea    uses bipap    Past Surgical History:  Procedure Laterality Date  . EXTRACORPOREAL SHOCK WAVE LITHOTRIPSY Right 01/27/2018   Procedure: RIGHT EXTRACORPOREAL SHOCK WAVE LITHOTRIPSY (ESWL);  Surgeon: Ihor Gully, MD;  Location: [facility];  Service: Urology;  Laterality: Right;  . WISDOM TOOTH EXTRACTION     age [age]    Current Medications: Current Meds  Medication Sig  . aspirin 81 MG chewable tablet Chew 81 mg by mouth daily.  Marland Kitchen atorvastatin (LIPITOR) 80 MG tablet Take 80 mg by mouth daily.  Marland Kitchen glimepiride (AMARYL) 4 MG tablet Take 4 mg by mouth daily.  Marland Kitchen lisinopril (ZESTRIL) 20 MG tablet Take 1 tablet by mouth daily.  . metFORMIN (GLUCOPHAGE) 1000 MG tablet   . nitroGLYCERIN (NITROSTAT) 0.4 MG SL tablet Place 1 tablet (0.4 mg total) under the tongue every 5 (five) minutes as needed.  . TRULICITY 3 MG/0.5ML SOPN      Allergies:   Patient has no known allergies.   Social History   Socioeconomic History  . Marital status: Married    Spouse name: Not on file  . Number of children: Not on file  . Years of education: Not on file  . Highest education level: Not on file  Occupational History  . Not on file  Tobacco Use  . Smoking status: Never Smoker  . Smokeless tobacco: Never Used  Substance and Sexual Activity  . Alcohol use: No    Alcohol/week: 0.0 standard drinks  . Drug use: No  . Sexual activity: Not on file  Other Topics Concern  . Not on file  Social History Narrative  . Not on file   Social Determinants of Health   Financial Resource Strain: Not on file  Food Insecurity: Not on file  Transportation Needs: Not on file  Physical Activity: Not on file  Stress: Not on file  Social Connections: Not on file     Family History: Paternal grandfather MI at 21, father MI at age 58  ROS:   Please see the history of present illness.     All other systems reviewed and are  negative.  EKGs/Labs/Other Studies Reviewed:    The following studies were reviewed today:   EKG:  EKG is  ordered today.  The ekg ordered today demonstrates normal sinus rhythm, PVC, rate 73, nonspecific T wave flattening  Recent Labs: No results found for requested labs within last 8760 hours.  Recent Lipid Panel No results found for: CHOL, TRIG, HDL, CHOLHDL, VLDL, LDLCALC, LDLDIRECT  Physical Exam:    VS:  BP 124/62 (BP Location: Left Arm, Patient Position: Sitting, Cuff Size: Normal)   Pulse 68   Ht 5\' 9"  (1.753 m)   Wt 239 lb (108.4 kg)   BMI 35.29 kg/m     Wt Readings from Last 3 Encounters:  09/16/20 239 lb (108.4 kg)  07/15/20 246 lb (111.6 kg)  07/05/20 246 lb 9.6 oz (111.9 kg)     GEN: Well nourished, well developed in no acute distress HEENT: Normal NECK: No JVD; No carotid bruits LYMPHATICS: No lymphadenopathy CARDIAC: RRR, no murmurs, rubs, gallops RESPIRATORY:  Clear to auscultation without rales, wheezing or rhonchi  ABDOMEN: Soft, non-tender, non-distended MUSCULOSKELETAL:  No edema; No deformity  SKIN: Warm and dry NEUROLOGIC:  Alert and oriented x 3 PSYCHIATRIC:  Normal affect   ASSESSMENT:    1. Coronary artery disease involving native coronary artery of native heart, unspecified whether angina present   2. Noncompaction cardiomyopathy (HCC)   3. Hyperlipidemia, unspecified hyperlipidemia type   4. Essential hypertension   5. Aortic dilatation (HCC)    PLAN:    CAD: Calcium score on 06/17/2020 was 963 (98th percentile).  Reports atypical chest pain.  Lexiscan Myoview on 07/15/2020 showed probably normal perfusion with mild diaphragmatic attenuation, no evidence of ischemia, LVEF 46%.  Echocardiogram on 07/22/2020 showed prominent trabeculations, consider noncompaction, LVEF 50 to 55%, normal RV function, no significant valvular disease. -Continue aspirin 81 mg daily -On atorvastatin 20 mg daily.  LDL 71 on 05/26/20.  Atorvastatin dose was increased to  80 mg daily  LV hypertrabeculation: Given LV hypertrabeculation and mildly reduced systolic function on echocardiogram, will evaluate for LV noncompaction with cardiac MRI  Hyperlipidemia: On atorvastatin 20 mg daily,  LDL 71 on 05/26/20.  Goal LDL less than 70 given CAD as above.  Atorvastatin dose was increased to 80 mg daily, will recheck lipid panel  Hypertension: On lisinopril 20 mg daily.  Appears controlled.  Aortic dilatation: Dilated ascending aorta measuring 40 mm on calcium score on 06/17/2020.  Will monitor with CTA chest in 1 year  T2DM on Trulicity, metformin, glimepiride.  A!c 8.5% on 05/25/20  OSA: on Bipap  Obesity: Body mass index is 35.29 kg/m.  Will refer to healthy weight and wellness  RTC in 6 months     Medication Adjustments/Labs and Tests Ordered: Current medicines  are reviewed at length with the patient today.  Concerns regarding medicines are outlined above.  No orders of the defined types were placed in this encounter.  No orders of the defined types were placed in this encounter.   There are no Patient Instructions on file for this visit.   Signed, Little Ishikawa, MD  09/16/2020 9:27 AM    [facility]

## 2020-09-16 ENCOUNTER — Other Ambulatory Visit: Payer: Self-pay

## 2020-09-16 ENCOUNTER — Encounter: Payer: Self-pay | Admitting: Cardiology

## 2020-09-16 ENCOUNTER — Ambulatory Visit (INDEPENDENT_AMBULATORY_CARE_PROVIDER_SITE_OTHER): Payer: Managed Care, Other (non HMO) | Admitting: Cardiology

## 2020-09-16 VITALS — BP 124/62 | HR 68 | Ht 69.0 in | Wt 239.0 lb

## 2020-09-16 DIAGNOSIS — Z7689 Persons encountering health services in other specified circumstances: Secondary | ICD-10-CM

## 2020-09-16 DIAGNOSIS — E785 Hyperlipidemia, unspecified: Secondary | ICD-10-CM

## 2020-09-16 DIAGNOSIS — I77819 Aortic ectasia, unspecified site: Secondary | ICD-10-CM

## 2020-09-16 DIAGNOSIS — I428 Other cardiomyopathies: Secondary | ICD-10-CM | POA: Diagnosis not present

## 2020-09-16 DIAGNOSIS — I2510 Atherosclerotic heart disease of native coronary artery without angina pectoris: Secondary | ICD-10-CM | POA: Diagnosis not present

## 2020-09-16 DIAGNOSIS — I10 Essential (primary) hypertension: Secondary | ICD-10-CM | POA: Diagnosis not present

## 2020-09-16 LAB — BASIC METABOLIC PANEL
BUN/Creatinine Ratio: 15 (ref 9–20)
BUN: 14 mg/dL (ref 6–24)
CO2: 23 mmol/L (ref 20–29)
Calcium: 9.5 mg/dL (ref 8.7–10.2)
Chloride: 104 mmol/L (ref 96–106)
Creatinine, Ser: 0.94 mg/dL (ref 0.76–1.27)
Glucose: 178 mg/dL — ABNORMAL HIGH (ref 65–99)
Potassium: 4.5 mmol/L (ref 3.5–5.2)
Sodium: 142 mmol/L (ref 134–144)
eGFR: 95 mL/min/{1.73_m2} (ref >59)

## 2020-09-16 LAB — LIPID PANEL
Chol/HDL Ratio: 2.8 ratio (ref 0.0–5.0)
Cholesterol, Total: 93 mg/dL — ABNORMAL LOW (ref 100–199)
HDL: 33 mg/dL — ABNORMAL LOW (ref >39)
LDL Chol Calc (NIH): 45 mg/dL (ref 0–99)
Triglycerides: 66 mg/dL (ref 0–149)
VLDL Cholesterol Cal: 15 mg/dL (ref 5–40)

## 2020-09-16 LAB — CBC
Hematocrit: 42.4 % (ref 37.5–51.0)
Hemoglobin: 14.1 g/dL (ref 13.0–17.7)
MCH: 30.2 pg (ref 26.6–33.0)
MCHC: 33.3 g/dL (ref 31.5–35.7)
MCV: 91 fL (ref 79–97)
Platelets: 261 10*3/uL (ref 150–450)
RBC: 4.67 x10E6/uL (ref 4.14–5.80)
RDW: 12.7 % (ref 11.6–15.4)
WBC: 6.9 10*3/uL (ref 3.4–10.8)

## 2020-09-16 NOTE — Patient Instructions (Signed)
  Lab Work:  Your physician recommends that you return for lab work today  If you have labs (blood work) drawn today and your tests are completely normal, you will receive your results only by: Marland Kitchen MyChart Message (if you have MyChart) OR . A paper copy in the mail If you have any lab test that is abnormal or we need to change your treatment, we will call you to review the results.   Testing/Procedures:  Your physician has requested that you have a cardiac MRI. Cardiac MRI uses a computer to create images of your heart as its beating, producing both still and moving pictures of your heart and major blood vessels. For further information please visit [email or web address]. Please follow the instruction sheet given to you today for more information.[facility]     Follow-Up: At [facility], you and your health needs are our priority.  As part of our continuing mission to provide you with exceptional heart care, we have created designated Provider Care Teams.  These Care Teams include your primary Cardiologist (physician) and Advanced Practice Providers (APPs -  Physician Assistants and Nurse Practitioners) who all work together to provide you with the care you need, when you need it.  We recommend signing up for the patient portal called "MyChart".  Sign up information is provided on this After Visit Summary.  MyChart is used to connect with patients for Virtual Visits (Telemedicine).  Patients are able to view lab/test results, encounter notes, upcoming appointments, etc.  Non-urgent messages can be sent to your provider as well.   To learn more about what you can do with MyChart, go to [email or web address].    Your next appointment:   6 month(s)  The format for your next appointment:   In Person  Provider:   Epifanio Lesches, MD

## 2020-09-20 ENCOUNTER — Encounter: Payer: Self-pay | Admitting: *Deleted

## 2020-09-21 ENCOUNTER — Other Ambulatory Visit: Payer: Self-pay | Admitting: *Deleted

## 2020-09-21 DIAGNOSIS — Z6835 Body mass index (BMI) 35.0-35.9, adult: Secondary | ICD-10-CM

## 2020-09-28 ENCOUNTER — Telehealth: Payer: Self-pay | Admitting: Cardiology

## 2020-09-28 ENCOUNTER — Encounter: Payer: Self-pay | Admitting: Cardiology

## 2020-09-28 NOTE — Telephone Encounter (Signed)
Patient  called regarding the Wednesday 10/12/20 8:00 am Cardiac MRI appointment at [facility]---arrival time is 7:30 am 1st floor admissions office for check in----informed patient I spoke with our insurance prior authorization department and this procedure is authorized.  He states he received a conflicting letter from his insurance company  (one line states it is authorized and the 2nd states it is not authorized)-I will mail the information to the patient and he voiced his understanding.

## 2020-10-10 ENCOUNTER — Telehealth (HOSPITAL_COMMUNITY): Payer: Self-pay | Admitting: *Deleted

## 2020-10-10 NOTE — Telephone Encounter (Signed)
Attempted to call patient regarding upcoming cardiac MRI appointment. Left message on voicemail with name and callback number  Theodor Mustin RN Navigator Cardiac Imaging [facility] [phone] Office [phone] Cell  

## 2020-10-12 ENCOUNTER — Ambulatory Visit (HOSPITAL_COMMUNITY)
Admission: RE | Admit: 2020-10-12 | Discharge: 2020-10-12 | Disposition: A | Discharge status: Home / Self Care | Payer: Managed Care, Other (non HMO) | Source: Ambulatory Visit | Attending: Cardiology | Admitting: Cardiology

## 2020-10-12 ENCOUNTER — Other Ambulatory Visit: Payer: Self-pay

## 2020-10-12 DIAGNOSIS — I428 Other cardiomyopathies: Secondary | ICD-10-CM

## 2020-10-12 MED ORDER — GADOBUTROL 1 MMOL/ML IV SOLN
10.0000 mL/kg | Freq: Once | INTRAVENOUS | Status: AC | PRN
  Administered 2020-10-12: 10 mL via INTRAVENOUS

## 2020-10-19 ENCOUNTER — Telehealth: Payer: Self-pay | Admitting: Cardiology

## 2020-10-19 MED ORDER — CARVEDILOL 3.125 MG PO TABS: 3.1250 mg | ORAL_TABLET | Freq: Two times a day (BID) | ORAL | 3 refills | Status: DC

## 2020-10-19 NOTE — Telephone Encounter (Signed)
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  He is on lisinopril, recommend adding carvedilol 3.125 mg twicedaily and follow-up in 3 months    Patient aware of results and recommendations.   Rx sent to pharmacy and appt scheduled for 55m f/u.

## 2020-10-19 NOTE — Telephone Encounter (Signed)
New message:     Patient calling to get some results from about 8 days ago

## 2021-01-30 ENCOUNTER — Ambulatory Visit: Payer: Managed Care, Other (non HMO) | Admitting: Cardiology

## 2021-02-09 NOTE — Progress Notes (Signed)
Cardiology Office Note:    Date:  02/10/2021   ID:  Nathaniel Conley, DOB 27-Aug-1965, MRN [id number]  PCP:  Kirby Funk, MD  Cardiologist:  None  Electrophysiologist:  None   Referring MD: Kirby Funk, MD   Chief Complaint  Patient presents with   Follow-up   Cardiomyopathy     History of Present Illness:    Nathaniel Conley is a 55 y.o. male with a hx of T2DM, hyperlipidemia, hypertension, OSA who presents for follow-up.  He was referred by Dr. Valentina Lucks for evaluation of CAD, initially seen on 07/05/2020.  Calcium score on 06/17/2020 was 963 (98th percentile).  Also noted to have dilated ascending aorta measuring 40 mm.  He does report occasional chest pain.  Describes as dull aching pain in center of his chest, occurs when he is under significant stress.  Occurs about once per month.  Has not noted any exertional chest pain.  He denies any dyspnea.  Reports chest pain typically last for 5 to 10 minutes.  Has had some lightheadedness recently with dietary changes, denies any syncope.  Has chronic headaches.  He denies any palpitations or lower extremity edema.  Does not exercise, has been limited by hip pain.  No smoking history.  Family history includes paternal grandfather died of MI at age 66 and father had MI at age 70.  Lexiscan Myoview on 07/15/2020 showed probably normal perfusion with mild diaphragmatic attenuation, no evidence of ischemia, LVEF 46%.  Echocardiogram on 07/22/2020 showed prominent trabeculations, consider noncompaction, LVEF 50 to 55%, normal RV function, no significant valvular disease.  Cardiac MRI on 10/12/2020 showed mild LV dilatation with mild systolic dysfunction (EF 48%), LV hypertrabeculation consistent with LV noncompaction, no LGE.  Since last clinic visit, he reports that he is doing well.  Denies any chest pain, dyspnea, lightheadedness, syncope, lower extremity edema, or palpitations.    Past Medical History:  Diagnosis Date   Chronic headaches    "  24-7" headache Pt. stated.   Diabetes mellitus without complication (HCC)    High cholesterol    History of kidney stones    Hypertension    Sleep apnea    uses bipap    Past Surgical History:  Procedure Laterality Date   EXTRACORPOREAL SHOCK WAVE LITHOTRIPSY Right 01/27/2018   Procedure: RIGHT EXTRACORPOREAL SHOCK WAVE LITHOTRIPSY (ESWL);  Surgeon: Ihor Gully, MD;  Location: [facility];  Service: Urology;  Laterality: Right;   WISDOM TOOTH EXTRACTION     [age]    Current Medications: Current Meds  Medication Sig   aspirin 81 MG chewable tablet Chew 81 mg by mouth daily.   atorvastatin (LIPITOR) 80 MG tablet Take 80 mg by mouth daily.   glimepiride (AMARYL) 4 MG tablet Take 4 mg by mouth daily.   lisinopril (ZESTRIL) 20 MG tablet Take 1 tablet by mouth daily.   metFORMIN (GLUCOPHAGE) 1000 MG tablet    TRULICITY 3 MG/0.5ML SOPN    [DISCONTINUED] carvedilol (COREG) 3.125 MG tablet Take 1 tablet (3.125 mg total) by mouth 2 (two) times daily.     Allergies:   Patient has no known allergies.   Social History   Socioeconomic History   Marital status: Married    Spouse name: Not on file   Number of children: Not on file   Years of education: Not on file   Highest education level: Not on file  Occupational History   Not on file  Tobacco Use   Smoking status: Never  Smokeless tobacco: Never  Substance and Sexual Activity   Alcohol use: No    Alcohol/week: 0.0 standard drinks   Drug use: No   Sexual activity: Not on file  Other Topics Concern   Not on file  Social History Narrative   Not on file   Social Determinants of Health   Financial Resource Strain: Not on file  Food Insecurity: Not on file  Transportation Needs: Not on file  Physical Activity: Not on file  Stress: Not on file  Social Connections: Not on file     Family History: Paternal grandfather MI at 38, father MI at age 2  ROS:   Please see the history of present illness.     All other  systems reviewed and are negative.  EKGs/Labs/Other Studies Reviewed:    The following studies were reviewed today:   EKG:  EKG is  ordered today.  The ekg ordered today demonstrates normal sinus rhythm, rate 68, nonspecific T wave flattening  Recent Labs: 09/16/2020: BUN 14; Creatinine, Ser 0.94; Hemoglobin 14.1; Platelets 261; Potassium 4.5; Sodium 142  Recent Lipid Panel    Component Value Date/Time   CHOL 93 (L) 09/16/2020 0936   TRIG 66 09/16/2020 0936   HDL 33 (L) 09/16/2020 0936   CHOLHDL 2.8 09/16/2020 0936   LDLCALC 45 09/16/2020 0936    Physical Exam:    VS:  BP 128/70 (BP Location: Left Arm, Patient Position: Sitting, Cuff Size: Large)   Pulse 68   Ht 5\' 9"  (1.753 m)   Wt 240 lb (108.9 kg)   BMI 35.44 kg/m     Wt Readings from Last 3 Encounters:  02/10/21 240 lb (108.9 kg)  09/16/20 239 lb (108.4 kg)  07/15/20 246 lb (111.6 kg)     GEN: Well nourished, well developed in no acute distress HEENT: Normal NECK: No JVD; No carotid bruits LYMPHATICS: No lymphadenopathy CARDIAC: RRR, no murmurs, rubs, gallops RESPIRATORY:  Clear to auscultation without rales, wheezing or rhonchi  ABDOMEN: Soft, non-tender, non-distended MUSCULOSKELETAL:  No edema; No deformity  SKIN: Warm and dry NEUROLOGIC:  Alert and oriented x 3 PSYCHIATRIC:  Normal affect   ASSESSMENT:    1. Coronary artery disease involving native coronary artery of native heart, unspecified whether angina present   2. Systolic dysfunction   3. Noncompaction cardiomyopathy (HCC)   4. Essential hypertension   5. Hyperlipidemia, unspecified hyperlipidemia type   6. Aortic dilatation (HCC)     PLAN:    CAD: Calcium score on 06/17/2020 was 963 (98th percentile).  Reports atypical chest pain.  Lexiscan Myoview on 07/15/2020 showed probably normal perfusion with mild diaphragmatic attenuation, no evidence of ischemia, LVEF 46%.  Echocardiogram on 07/22/2020 showed prominent trabeculations, consider  noncompaction, LVEF 50 to 55%, normal RV function, no significant valvular disease. -Continue aspirin 81 mg daily -Continue atorvastatin 80 mg daily  Systolic dysfunction: Echocardiogram on 07/22/2020 showed prominent trabeculations, consider noncompaction, LVEF 50 to 55%, normal RV function, no significant valvular disease.  Cardiac MRI on 10/12/2020 showed mild LV dilatation with mild systolic dysfunction (EF 48%), LV hypertrabeculation consistent with LV noncompaction, no LGE. -Continue lisinopril 20 mg daily -Continue carvedilol, increase to 6.25 mg twice daily -Discussed genetic testing, he states that he will consider. -Recommend Zio patch x7 days to evaluate for arrhythmia given suspected LV noncompaction cardiomyopathy  Hyperlipidemia: On atorvastatin 80 mg daily, LDL 45 on 09/16/2020  Hypertension: On lisinopril 20 mg daily, will increase carvedilol to 6.25 mg twice daily as above  Aortic dilatation: Dilated ascending aorta measuring 40 mm on calcium score on 06/17/2020.  Will monitor with CTA chest in 1 year  T2DM on Trulicity, metformin, glimepiride.  A1c 7.5 in 09/29/20  OSA: on Bipap  Obesity: Body mass index is 35.44 kg/m.  Referred to healthy weight and wellness  RTC in 6 months     Medication Adjustments/Labs and Tests Ordered: Current medicines are reviewed at length with the patient today.  Concerns regarding medicines are outlined above.  Orders Placed This Encounter  Procedures   CT ANGIO CHEST AORTA W/CM & OR WO/CM   LONG TERM MONITOR (3-14 DAYS)   EKG 12-Lead    Meds ordered this encounter  Medications   carvedilol (COREG) 6.25 MG tablet    Sig: Take 1 tablet (6.25 mg total) by mouth 2 (two) times daily.    Dispense:  180 tablet    Refill:  3    Dose increase     Patient Instructions  Medication Instructions:  INCREASE carvedilol (Coreg) to 6.25 mg two times daily  *If you need a refill on your cardiac medications before your next appointment, please  call your pharmacy*  ZIO XT- Long Term Monitor Instructions   Your physician has requested you wear a ZIO patch monitor for _7__ days.  This is a single patch monitor.   [employer] supplies one patch monitor per enrollment. Additional stickers are not available. Please do not apply patch if you will be having a Nuclear Stress Test, Echocardiogram, Cardiac CT, MRI, or Chest Xray during the period you would be wearing the monitor. The patch cannot be worn during these tests. You cannot remove and re-apply the ZIO XT patch monitor.  Your ZIO patch monitor will be sent Fed Ex from [employer] directly to your home address. It may take 3-5 days to receive your monitor after you have been enrolled.  Once you have received your monitor, please review the enclosed instructions. Your monitor has already been registered assigning a specific monitor serial # to you.  Billing and Patient Assistance Program Information   We have supplied [employer] with any of your insurance information on file for billing purposes. [employer] offers a sliding scale Patient Assistance Program for patients that do not have insurance, or whose insurance does not completely cover the cost of the ZIO monitor.   You must apply for the Patient Assistance Program to qualify for this discounted rate.     To apply, please call [employer] at [phone], select option 4, then select option 2, and ask to apply for Patient Assistance Program.  Meredeth Ide will ask your household income, and how many people are in your household.  They will quote your out-of-pocket cost based on that information.  [employer] will also be able to set up a 85-month, interest-free payment plan if needed.  Applying the monitor   Shave hair from upper left chest.  Hold abrader disc by orange tab. Rub abrader in 40 strokes over the upper left chest as indicated in your monitor instructions.  Clean area with 4 enclosed alcohol pads. Let dry.  Apply patch as indicated in  monitor instructions. Patch will be placed under collarbone on left side of chest with arrow pointing upward.  Rub patch adhesive wings for 2 minutes. Remove white label marked "1". Remove the white label marked "2". Rub patch adhesive wings for 2 additional minutes.  While looking in a mirror, press and release button in center of patch. A small green light will flash  3-4 times. This will be your only indicator that the monitor has been turned on. ?  Do not shower for the first 24 hours. You may shower after the first 24 hours.  Press the button if you feel a symptom. You will hear a small click. Record Date, Time and Symptom in the Patient Logbook.  When you are ready to remove the patch, follow instructions on the last 2 pages of the Patient Logbook. Stick patch monitor onto the last page of Patient Logbook.  Place Patient Logbook in the blue and white box.  Use locking tab on box and tape box closed securely.  The blue and white box has prepaid postage on it. Please place it in the mailbox as soon as possible. Your physician should have your test results approximately 7 days after the monitor has been mailed back to [facility].  Call [facility] Customer Care at [phone] if you have questions regarding your ZIO XT patch monitor. Call them immediately if you see an orange light blinking on your monitor.  If your monitor falls off in less than 4 days, contact our Monitor department at [phone]. ?If your monitor becomes loose or falls off after 4 days call [employer] at [phone] for suggestions on securing your monitor.?  Follow-Up: At [facility], you and your health needs are our priority.  As part of our continuing mission to provide you with exceptional heart care, we have created designated Provider Care Teams.  These Care Teams include your primary Cardiologist (physician) and Advanced Practice Providers (APPs -  Physician Assistants and Nurse Practitioners) who all work  together to provide you with the care you need, when you need it.  We recommend signing up for the patient portal called "MyChart".  Sign up information is provided on this After Visit Summary.  MyChart is used to connect with patients for Virtual Visits (Telemedicine).  Patients are able to view lab/test results, encounter notes, upcoming appointments, etc.  Non-urgent messages can be sent to your provider as well.   To learn more about what you can do with MyChart, go to [email or web address].    Your next appointment:   6 month(s)  The format for your next appointment:   In Person  Provider:   Epifanio Lesches, MD     Signed, Little Ishikawa, MD  02/10/2021 12:15 PM    [facility]

## 2021-02-10 ENCOUNTER — Ambulatory Visit (INDEPENDENT_AMBULATORY_CARE_PROVIDER_SITE_OTHER): Payer: Managed Care, Other (non HMO) | Admitting: Cardiology

## 2021-02-10 ENCOUNTER — Ambulatory Visit (INDEPENDENT_AMBULATORY_CARE_PROVIDER_SITE_OTHER): Payer: Managed Care, Other (non HMO)

## 2021-02-10 ENCOUNTER — Other Ambulatory Visit: Payer: Self-pay

## 2021-02-10 ENCOUNTER — Encounter: Payer: Self-pay | Admitting: Cardiology

## 2021-02-10 VITALS — BP 128/70 | HR 68 | Ht 69.0 in | Wt 240.0 lb

## 2021-02-10 DIAGNOSIS — I10 Essential (primary) hypertension: Secondary | ICD-10-CM

## 2021-02-10 DIAGNOSIS — I519 Heart disease, unspecified: Secondary | ICD-10-CM | POA: Diagnosis not present

## 2021-02-10 DIAGNOSIS — E785 Hyperlipidemia, unspecified: Secondary | ICD-10-CM

## 2021-02-10 DIAGNOSIS — I77819 Aortic ectasia, unspecified site: Secondary | ICD-10-CM

## 2021-02-10 DIAGNOSIS — I428 Other cardiomyopathies: Secondary | ICD-10-CM

## 2021-02-10 DIAGNOSIS — I2510 Atherosclerotic heart disease of native coronary artery without angina pectoris: Secondary | ICD-10-CM

## 2021-02-10 MED ORDER — CARVEDILOL 6.25 MG PO TABS: 6.2500 mg | ORAL_TABLET | Freq: Two times a day (BID) | ORAL | 3 refills | Status: DC

## 2021-02-10 NOTE — Progress Notes (Unsigned)
Enrolled patient for a 7 day Zio XT monitor to be mailed to patients home.  

## 2021-02-10 NOTE — Patient Instructions (Signed)
Medication Instructions:  INCREASE carvedilol (Coreg) to 6.25 mg two times daily  *If you need a refill on your cardiac medications before your next appointment, please call your pharmacy*  ZIO XT- Long Term Monitor Instructions   Your physician has requested you wear a ZIO patch monitor for _7__ days.  This is a single patch monitor.   [employer] supplies one patch monitor per enrollment. Additional stickers are not available. Please do not apply patch if you will be having a Nuclear Stress Test, Echocardiogram, Cardiac CT, MRI, or Chest Xray during the period you would be wearing the monitor. The patch cannot be worn during these tests. You cannot remove and re-apply the ZIO XT patch monitor.  Your ZIO patch monitor will be sent Fed Ex from [employer] directly to your home address. It may take 3-5 days to receive your monitor after you have been enrolled.  Once you have received your monitor, please review the enclosed instructions. Your monitor has already been registered assigning a specific monitor serial # to you.  Billing and Patient Assistance Program Information   We have supplied [employer] with any of your insurance information on file for billing purposes. [employer] offers a sliding scale Patient Assistance Program for patients that do not have insurance, or whose insurance does not completely cover the cost of the ZIO monitor.   You must apply for the Patient Assistance Program to qualify for this discounted rate.     To apply, please call [employer] at [phone], select option 4, then select option 2, and ask to apply for Patient Assistance Program.  Meredeth Ide will ask your household income, and how many people are in your household.  They will quote your out-of-pocket cost based on that information.  [employer] will also be able to set up a 85-month, interest-free payment plan if needed.  Applying the monitor   Shave hair from upper left chest.  Hold abrader disc by orange tab. Rub  abrader in 40 strokes over the upper left chest as indicated in your monitor instructions.  Clean area with 4 enclosed alcohol pads. Let dry.  Apply patch as indicated in monitor instructions. Patch will be placed under collarbone on left side of chest with arrow pointing upward.  Rub patch adhesive wings for 2 minutes. Remove white label marked "1". Remove the white label marked "2". Rub patch adhesive wings for 2 additional minutes.  While looking in a mirror, press and release button in center of patch. A small green light will flash 3-4 times. This will be your only indicator that the monitor has been turned on. ?  Do not shower for the first 24 hours. You may shower after the first 24 hours.  Press the button if you feel a symptom. You will hear a small click. Record Date, Time and Symptom in the Patient Logbook.  When you are ready to remove the patch, follow instructions on the last 2 pages of the Patient Logbook. Stick patch monitor onto the last page of Patient Logbook.  Place Patient Logbook in the blue and white box.  Use locking tab on box and tape box closed securely.  The blue and white box has prepaid postage on it. Please place it in the mailbox as soon as possible. Your physician should have your test results approximately 7 days after the monitor has been mailed back to [facility].  Call [facility] Customer Care at [phone] if you have questions regarding your ZIO XT patch monitor. Call them immediately  if you see an orange light blinking on your monitor.  If your monitor falls off in less than 4 days, contact our Monitor department at [phone]. ?If your monitor becomes loose or falls off after 4 days call [employer] at [phone] for suggestions on securing your monitor.?  Follow-Up: At [facility], you and your health needs are our priority.  As part of our continuing mission to provide you with exceptional heart care, we have created designated Provider Care  Teams.  These Care Teams include your primary Cardiologist (physician) and Advanced Practice Providers (APPs -  Physician Assistants and Nurse Practitioners) who all work together to provide you with the care you need, when you need it.  We recommend signing up for the patient portal called "MyChart".  Sign up information is provided on this After Visit Summary.  MyChart is used to connect with patients for Virtual Visits (Telemedicine).  Patients are able to view lab/test results, encounter notes, upcoming appointments, etc.  Non-urgent messages can be sent to your provider as well.   To learn more about what you can do with MyChart, go to [email or web address].    Your next appointment:   6 month(s)  The format for your next appointment:   In Person  Provider:   Epifanio Lesches, MD

## 2021-02-16 DIAGNOSIS — I428 Other cardiomyopathies: Secondary | ICD-10-CM

## 2021-03-13 ENCOUNTER — Encounter: Payer: Self-pay | Admitting: *Deleted

## 2021-03-20 ENCOUNTER — Ambulatory Visit: Payer: Managed Care, Other (non HMO) | Admitting: Cardiology

## 2021-03-21 ENCOUNTER — Other Ambulatory Visit: Payer: Self-pay

## 2021-03-21 NOTE — Telephone Encounter (Signed)
[facility] mail order pharmacy is requesting a refill. Please address

## 2021-03-23 MED ORDER — CARVEDILOL 6.25 MG PO TABS: 6.2500 mg | ORAL_TABLET | Freq: Two times a day (BID) | ORAL | 3 refills | Status: DC

## 2021-05-22 DIAGNOSIS — U071 COVID-19: Secondary | ICD-10-CM | POA: Diagnosis not present

## 2021-05-22 DIAGNOSIS — J029 Acute pharyngitis, unspecified: Secondary | ICD-10-CM | POA: Diagnosis not present

## 2021-06-09 DIAGNOSIS — E1142 Type 2 diabetes mellitus with diabetic polyneuropathy: Secondary | ICD-10-CM | POA: Diagnosis not present

## 2021-06-09 DIAGNOSIS — E1122 Type 2 diabetes mellitus with diabetic chronic kidney disease: Secondary | ICD-10-CM | POA: Diagnosis not present

## 2021-06-09 DIAGNOSIS — Z0000 Encounter for general adult medical examination without abnormal findings: Secondary | ICD-10-CM | POA: Diagnosis not present

## 2021-06-09 DIAGNOSIS — E7800 Pure hypercholesterolemia, unspecified: Secondary | ICD-10-CM | POA: Diagnosis not present

## 2021-06-09 DIAGNOSIS — I10 Essential (primary) hypertension: Secondary | ICD-10-CM | POA: Diagnosis not present

## 2021-06-09 DIAGNOSIS — N182 Chronic kidney disease, stage 2 (mild): Secondary | ICD-10-CM | POA: Diagnosis not present

## 2021-09-01 DIAGNOSIS — M25551 Pain in right hip: Secondary | ICD-10-CM | POA: Diagnosis not present

## 2021-10-15 NOTE — Progress Notes (Unsigned)
Cardiology Office Note:    Date:  10/18/2021   ID:  Nathaniel Conley, DOB January 07, 1966, MRN [id number]  PCP:  Kirby Funk, MD  Cardiologist:  None  Electrophysiologist:  None   Referring MD: Kirby Funk, MD   Chief Complaint  Patient presents with   Coronary Artery Disease     History of Present Illness:    Nathaniel Conley is a 56 y.o. male with a hx of T2DM, hyperlipidemia, hypertension, OSA who presents for follow-up.  He was referred by Dr. Valentina Lucks for evaluation of CAD, initially seen on 07/05/2020.  Calcium score on 06/17/2020 was 963 (98th percentile).  Also noted to have dilated ascending aorta measuring 40 mm.  He does report occasional chest pain.  Describes as dull aching pain in center of his chest, occurs when he is under significant stress.  Occurs about once per month.  Has not noted any exertional chest pain.  He denies any dyspnea.  Reports chest pain typically last for 5 to 10 minutes.  Has had some lightheadedness recently with dietary changes, denies any syncope.  Has chronic headaches.  He denies any palpitations or lower extremity edema.  Does not exercise, has been limited by hip pain.  No smoking history.  Family history includes paternal grandfather died of MI at age 75 and father had MI at age 54.  Lexiscan Myoview on 07/15/2020 showed probably normal perfusion with mild diaphragmatic attenuation, no evidence of ischemia, LVEF 46%.  Echocardiogram on 07/22/2020 showed prominent trabeculations, consider noncompaction, LVEF 50 to 55%, normal RV function, no significant valvular disease.  Cardiac MRI on 10/12/2020 showed mild LV dilatation with mild systolic dysfunction (EF 48%), LV hypertrabeculation consistent with LV noncompaction, no LGE.  Zio patch x7 days on 03/02/2021 showed no significant arrhythmias.  Since last clinic visit, he reports that he has been doing okay.  Reports occasional chest pain, but states that it just last for few seconds and resolves.  Denies any  dyspnea, lightheadedness, syncope, lower extremity edema, or palpitations.  Reports BP 120-130s at home.      Past Medical History:  Diagnosis Date   Chronic headaches    " 24-7" headache Pt. stated.   Diabetes mellitus without complication (HCC)    High cholesterol    History of kidney stones    Hypertension    Sleep apnea    uses bipap    Past Surgical History:  Procedure Laterality Date   EXTRACORPOREAL SHOCK WAVE LITHOTRIPSY Right 01/27/2018   Procedure: RIGHT EXTRACORPOREAL SHOCK WAVE LITHOTRIPSY (ESWL);  Surgeon: Ihor Gully, MD;  Location: [facility];  Service: Urology;  Laterality: Right;   WISDOM TOOTH EXTRACTION     age [age]    Current Medications: Current Meds  Medication Sig   aspirin 81 MG chewable tablet Chew 81 mg by mouth daily.   atorvastatin (LIPITOR) 80 MG tablet Take 80 mg by mouth daily.   carvedilol (COREG) 12.5 MG tablet Take 1 tablet (12.5 mg total) by mouth 2 (two) times daily.   glimepiride (AMARYL) 4 MG tablet Take 4 mg by mouth daily.   lisinopril (ZESTRIL) 20 MG tablet Take 1 tablet by mouth daily.   metFORMIN (GLUCOPHAGE) 1000 MG tablet    TRULICITY 3 MG/0.5ML SOPN    [DISCONTINUED] carvedilol (COREG) 6.25 MG tablet Take 1 tablet (6.25 mg total) by mouth 2 (two) times daily.     Allergies:   Patient has no known allergies.   Social History   Socioeconomic History  Marital status: Married    Spouse name: Not on file   Number of children: Not on file   Years of education: Not on file   Highest education level: Not on file  Occupational History   Not on file  Tobacco Use   Smoking status: Never   Smokeless tobacco: Never  Substance and Sexual Activity   Alcohol use: No    Alcohol/week: 0.0 standard drinks of alcohol   Drug use: No   Sexual activity: Not on file  Other Topics Concern   Not on file  Social History Narrative   Not on file   Social Determinants of Health   Financial Resource Strain: Not on file  Food Insecurity:  Not on file  Transportation Needs: Not on file  Physical Activity: Not on file  Stress: Not on file  Social Connections: Not on file     Family History: Paternal grandfather MI at 34, father MI at age 48  ROS:   Please see the history of present illness.     All other systems reviewed and are negative.  EKGs/Labs/Other Studies Reviewed:    The following studies were reviewed today:   EKG:  EKG is  ordered today.  The ekg ordered today demonstrates normal sinus rhythm, rate 68, nonspecific T wave flattening  Recent Labs: No results found for requested labs within last 365 days.  Recent Lipid Panel    Component Value Date/Time   CHOL 93 (L) 09/16/2020 0936   TRIG 66 09/16/2020 0936   HDL 33 (L) 09/16/2020 0936   CHOLHDL 2.8 09/16/2020 0936   LDLCALC 45 09/16/2020 0936    Physical Exam:    VS:  BP 140/80   Pulse 66   Ht 5\' 9"  (1.753 m)   Wt 250 lb 6.4 oz (113.6 kg)   SpO2 99%   BMI 36.98 kg/m     Wt Readings from Last 3 Encounters:  10/16/21 250 lb 6.4 oz (113.6 kg)  02/10/21 240 lb (108.9 kg)  09/16/20 239 lb (108.4 kg)     GEN: Well nourished, well developed in no acute distress HEENT: Normal NECK: No JVD; No carotid bruits LYMPHATICS: No lymphadenopathy CARDIAC: RRR, no murmurs, rubs, gallops RESPIRATORY:  Clear to auscultation without rales, wheezing or rhonchi  ABDOMEN: Soft, non-tender, non-distended MUSCULOSKELETAL:  Trace edema; No deformity  SKIN: Warm and dry NEUROLOGIC:  Alert and oriented x 3 PSYCHIATRIC:  Normal affect   ASSESSMENT:    1. Coronary artery disease involving native coronary artery of native heart, unspecified whether angina present   2. Systolic dysfunction   3. Aortic dilatation (HCC)   4. Hyperlipidemia, unspecified hyperlipidemia type   5. Essential hypertension      PLAN:    CAD: Calcium score on 06/17/2020 was 963 (98th percentile).  Reports atypical chest pain.  Lexiscan Myoview on 07/15/2020 showed probably normal  perfusion with mild diaphragmatic attenuation, no evidence of ischemia, LVEF 46%.  Echocardiogram on 07/22/2020 showed prominent trabeculations, consider noncompaction, LVEF 50 to 55%, normal RV function, no significant valvular disease. -Continue aspirin 81 mg daily -Continue atorvastatin 80 mg daily  Systolic dysfunction: Echocardiogram on 07/22/2020 showed prominent trabeculations, consider noncompaction, LVEF 50 to 55%, normal RV function, no significant valvular disease.  Cardiac MRI on 10/12/2020 showed mild LV dilatation with mild systolic dysfunction (EF 48%), LV hypertrabeculation consistent with LV noncompaction, no LGE. -Continue lisinopril 20 mg daily -Continue carvedilol, increase to 12.5 mg twice daily -Discussed genetic testing, he is not interested at  this time -Zio patch x7 days on 03/02/2021 showed no significant arrhythmias.  Hyperlipidemia: On atorvastatin 80 mg daily, LDL 37 on 03/21/21  Hypertension: On lisinopril 20 mg daily and Coreg 6.25 mg twice daily.  BP elevated, will increase carvedilol to 12.5 mg twice daily.  Asked to check BP daily for next 2 weeks and call with results  Aortic dilatation: Dilated ascending aorta measuring 40 mm on calcium score on 06/17/2020.  Will monitor with repeat CTA chest  T2DM: on Trulicity, metformin, glimepiride.  A1c 7.7 on 10/13/20.  Recommend SGLT2 inhibitor, states he will discuss with Dr. Valentina Lucks, has upcoming appointment for diabetes management  OSA: on Bipap, using nightly  Obesity: Body mass index is 36.98 kg/m.  Referred to healthy weight and wellness  RTC in 6 months     Medication Adjustments/Labs and Tests Ordered: Current medicines are reviewed at length with the patient today.  Concerns regarding medicines are outlined above.  Orders Placed This Encounter  Procedures   CT ANGIO CHEST AORTA W/CM & OR WO/CM    Meds ordered this encounter  Medications   carvedilol (COREG) 12.5 MG tablet    Sig: Take 1 tablet (12.5 mg  total) by mouth 2 (two) times daily.    Dispense:  180 tablet    Refill:  3     Patient Instructions  Medication Instructions:  Your physician has recommended you make the following change in your medication:  INCREASE: Carvedilol 12.5 mg twice daily  Please take your blood pressure daily for 2 weeks then contact us with those readings. Please include heart rates.   HOW TO TAKE YOUR BLOOD PRESSURE: Rest 5 minutes before taking your blood pressure. Don't smoke or drink caffeinated beverages for at least 30 minutes before. Take your blood pressure before (not after) you eat. Sit comfortably with your back supported and both feet on the floor (don't cross your legs). Elevate your arm to heart level on a table or a desk. Use the proper sized cuff. It should fit smoothly and snugly around your bare upper arm. There should be enough room to slip a fingertip under the cuff. The bottom edge of the cuff should be 1 inch above the crease of the elbow. Ideally, take 3 measurements at one sitting and record the average.  *If you need a refill on your cardiac medications before your next appointment, please call your pharmacy*   Lab Work: None If you have labs (blood work) drawn today and your tests are completely normal, you will receive your results only by: MyChart Message (if you have MyChart) OR A paper copy in the mail If you have any lab test that is abnormal or we need to change your treatment, we will call you to review the results.   Testing/Procedures: None   Follow-Up: At [facility], you and your health needs are our priority.  As part of our continuing mission to provide you with exceptional heart care, we have created designated Provider Care Teams.  These Care Teams include your primary Cardiologist (physician) and Advanced Practice Providers (APPs -  Physician Assistants and Nurse Practitioners) who all work together to provide you with the care you need, when you need  it.  We recommend signing up for the patient portal called "MyChart".  Sign up information is provided on this After Visit Summary.  MyChart is used to connect with patients for Virtual Visits (Telemedicine).  Patients are able to view lab/test results, encounter notes, upcoming appointments, etc.  Non-urgent messages can be sent to your provider as well.   To learn more about what you can do with MyChart, go to [email or web address].    Your next appointment:   6 month(s)  The format for your next appointment:   In Person  Provider:   Epifanio Lesches, MD   Other Instructions When you go to your PCP please ask about starting Farxiga.   Important Information About Sugar         Signed, Little Ishikawa, MD  10/18/2021 10:47 PM    [facility]

## 2021-10-16 ENCOUNTER — Encounter: Payer: Self-pay | Admitting: Cardiology

## 2021-10-16 ENCOUNTER — Ambulatory Visit (INDEPENDENT_AMBULATORY_CARE_PROVIDER_SITE_OTHER): Payer: BC Managed Care – PPO | Admitting: Cardiology

## 2021-10-16 VITALS — BP 140/80 | HR 66 | Ht 69.0 in | Wt 250.4 lb

## 2021-10-16 DIAGNOSIS — I2510 Atherosclerotic heart disease of native coronary artery without angina pectoris: Secondary | ICD-10-CM

## 2021-10-16 DIAGNOSIS — I77819 Aortic ectasia, unspecified site: Secondary | ICD-10-CM

## 2021-10-16 DIAGNOSIS — I519 Heart disease, unspecified: Secondary | ICD-10-CM

## 2021-10-16 DIAGNOSIS — E785 Hyperlipidemia, unspecified: Secondary | ICD-10-CM

## 2021-10-16 DIAGNOSIS — I10 Essential (primary) hypertension: Secondary | ICD-10-CM

## 2021-10-16 MED ORDER — CARVEDILOL 12.5 MG PO TABS: 12.5000 mg | ORAL_TABLET | Freq: Two times a day (BID) | ORAL | 3 refills | Status: DC

## 2021-10-16 NOTE — Patient Instructions (Addendum)
Medication Instructions:  Your physician has recommended you make the following change in your medication:  INCREASE: Carvedilol 12.5 mg twice daily  Please take your blood pressure daily for 2 weeks then contact us with those readings. Please include heart rates.   HOW TO TAKE YOUR BLOOD PRESSURE: Rest 5 minutes before taking your blood pressure. Don't smoke or drink caffeinated beverages for at least 30 minutes before. Take your blood pressure before (not after) you eat. Sit comfortably with your back supported and both feet on the floor (don't cross your legs). Elevate your arm to heart level on a table or a desk. Use the proper sized cuff. It should fit smoothly and snugly around your bare upper arm. There should be enough room to slip a fingertip under the cuff. The bottom edge of the cuff should be 1 inch above the crease of the elbow. Ideally, take 3 measurements at one sitting and record the average.  *If you need a refill on your cardiac medications before your next appointment, please call your pharmacy*   Lab Work: None If you have labs (blood work) drawn today and your tests are completely normal, you will receive your results only by: MyChart Message (if you have MyChart) OR A paper copy in the mail If you have any lab test that is abnormal or we need to change your treatment, we will call you to review the results.   Testing/Procedures: None   Follow-Up: At [facility], you and your health needs are our priority.  As part of our continuing mission to provide you with exceptional heart care, we have created designated Provider Care Teams.  These Care Teams include your primary Cardiologist (physician) and Advanced Practice Providers (APPs -  Physician Assistants and Nurse Practitioners) who all work together to provide you with the care you need, when you need it.  We recommend signing up for the patient portal called "MyChart".  Sign up information is provided on  this After Visit Summary.  MyChart is used to connect with patients for Virtual Visits (Telemedicine).  Patients are able to view lab/test results, encounter notes, upcoming appointments, etc.  Non-urgent messages can be sent to your provider as well.   To learn more about what you can do with MyChart, go to [email or web address].    Your next appointment:   6 month(s)  The format for your next appointment:   In Person  Provider:   Epifanio Lesches, MD   Other Instructions When you go to your PCP please ask about starting Farxiga.   Important Information About Sugar

## 2021-10-27 DIAGNOSIS — N182 Chronic kidney disease, stage 2 (mild): Secondary | ICD-10-CM | POA: Diagnosis not present

## 2021-10-27 DIAGNOSIS — I10 Essential (primary) hypertension: Secondary | ICD-10-CM | POA: Diagnosis not present

## 2021-10-27 DIAGNOSIS — E1122 Type 2 diabetes mellitus with diabetic chronic kidney disease: Secondary | ICD-10-CM | POA: Diagnosis not present

## 2021-10-27 DIAGNOSIS — E1142 Type 2 diabetes mellitus with diabetic polyneuropathy: Secondary | ICD-10-CM | POA: Diagnosis not present

## 2021-11-01 ENCOUNTER — Other Ambulatory Visit: Payer: Self-pay | Admitting: Sports Medicine

## 2021-11-01 ENCOUNTER — Ambulatory Visit
Admission: RE | Admit: 2021-11-01 | Discharge: 2021-11-01 | Disposition: A | Discharge status: Home / Self Care | Payer: BC Managed Care – PPO | Source: Ambulatory Visit | Attending: Sports Medicine | Admitting: Sports Medicine

## 2021-11-01 DIAGNOSIS — M25551 Pain in right hip: Secondary | ICD-10-CM | POA: Diagnosis not present

## 2021-11-03 ENCOUNTER — Other Ambulatory Visit: Payer: Self-pay | Admitting: Sports Medicine

## 2021-11-03 DIAGNOSIS — M25551 Pain in right hip: Secondary | ICD-10-CM

## 2021-11-13 ENCOUNTER — Ambulatory Visit
Admission: RE | Admit: 2021-11-13 | Discharge: 2021-11-13 | Disposition: A | Discharge status: Home / Self Care | Payer: BC Managed Care – PPO | Source: Ambulatory Visit | Attending: Sports Medicine | Admitting: Sports Medicine

## 2021-11-13 ENCOUNTER — Ambulatory Visit
Admission: RE | Admit: 2021-11-13 | Discharge: 2021-11-13 | Disposition: A | Discharge status: Home / Self Care | Payer: BC Managed Care – PPO | Source: Ambulatory Visit | Attending: Cardiology | Admitting: Cardiology

## 2021-11-13 DIAGNOSIS — M25551 Pain in right hip: Secondary | ICD-10-CM

## 2021-11-13 DIAGNOSIS — I77819 Aortic ectasia, unspecified site: Secondary | ICD-10-CM

## 2021-11-13 DIAGNOSIS — E882 Lipomatosis, not elsewhere classified: Secondary | ICD-10-CM | POA: Diagnosis not present

## 2021-11-13 DIAGNOSIS — E278 Other specified disorders of adrenal gland: Secondary | ICD-10-CM | POA: Diagnosis not present

## 2021-11-13 DIAGNOSIS — I2510 Atherosclerotic heart disease of native coronary artery without angina pectoris: Secondary | ICD-10-CM | POA: Diagnosis not present

## 2021-11-13 DIAGNOSIS — Q231 Congenital insufficiency of aortic valve: Secondary | ICD-10-CM | POA: Diagnosis not present

## 2021-11-13 MED ORDER — IOPAMIDOL (ISOVUE-370) INJECTION 76%
75.0000 mL | Freq: Once | INTRAVENOUS | Status: AC | PRN
  Administered 2021-11-13: 75 mL via INTRAVENOUS

## 2021-11-14 ENCOUNTER — Other Ambulatory Visit: Payer: Self-pay | Admitting: *Deleted

## 2021-11-14 DIAGNOSIS — K760 Fatty (change of) liver, not elsewhere classified: Secondary | ICD-10-CM

## 2021-11-16 DIAGNOSIS — K760 Fatty (change of) liver, not elsewhere classified: Secondary | ICD-10-CM | POA: Diagnosis not present

## 2021-11-16 LAB — HEPATIC FUNCTION PANEL
ALT: 37 IU/L (ref 0–44)
AST: 28 IU/L (ref 0–40)
Albumin: 4.7 g/dL (ref 3.8–4.9)
Alkaline Phosphatase: 75 IU/L (ref 44–121)
Bilirubin Total: 0.4 mg/dL (ref 0.0–1.2)
Bilirubin, Direct: 0.13 mg/dL (ref 0.00–0.40)
Total Protein: 7.6 g/dL (ref 6.0–8.5)

## 2021-12-13 ENCOUNTER — Encounter: Payer: Self-pay | Admitting: *Deleted

## 2022-01-23 ENCOUNTER — Telehealth: Payer: Self-pay | Admitting: Cardiology

## 2022-01-23 MED ORDER — CARVEDILOL 12.5 MG PO TABS: 12.5000 mg | ORAL_TABLET | Freq: Two times a day (BID) | ORAL | 3 refills | Status: DC

## 2022-01-23 NOTE — Telephone Encounter (Signed)
*  STAT* If patient is at the pharmacy, call can be transferred to refill team.   1. Which medications need to be refilled? (please list name of each medication and dose if known)  carvedilol (COREG) 12.5 MG tablet  2. Which pharmacy/location (including street and city if local pharmacy) is medication to be sent to? [facility]  3. Do they need a 30 day or 90 day supply?  90 day supply

## 2022-01-24 ENCOUNTER — Other Ambulatory Visit: Payer: Self-pay

## 2022-02-12 DIAGNOSIS — M545 Low back pain, unspecified: Secondary | ICD-10-CM | POA: Diagnosis not present

## 2022-02-12 DIAGNOSIS — N50811 Right testicular pain: Secondary | ICD-10-CM | POA: Diagnosis not present

## 2022-03-02 DIAGNOSIS — I25118 Atherosclerotic heart disease of native coronary artery with other forms of angina pectoris: Secondary | ICD-10-CM | POA: Diagnosis not present

## 2022-03-02 DIAGNOSIS — E538 Deficiency of other specified B group vitamins: Secondary | ICD-10-CM | POA: Diagnosis not present

## 2022-03-02 DIAGNOSIS — G4733 Obstructive sleep apnea (adult) (pediatric): Secondary | ICD-10-CM | POA: Diagnosis not present

## 2022-03-02 DIAGNOSIS — I7121 Aneurysm of the ascending aorta, without rupture: Secondary | ICD-10-CM | POA: Diagnosis not present

## 2022-03-02 DIAGNOSIS — E7800 Pure hypercholesterolemia, unspecified: Secondary | ICD-10-CM | POA: Diagnosis not present

## 2022-03-02 DIAGNOSIS — E1165 Type 2 diabetes mellitus with hyperglycemia: Secondary | ICD-10-CM | POA: Diagnosis not present

## 2022-03-02 DIAGNOSIS — Z23 Encounter for immunization: Secondary | ICD-10-CM | POA: Diagnosis not present

## 2022-03-02 DIAGNOSIS — I10 Essential (primary) hypertension: Secondary | ICD-10-CM | POA: Diagnosis not present

## 2022-03-02 DIAGNOSIS — E1142 Type 2 diabetes mellitus with diabetic polyneuropathy: Secondary | ICD-10-CM | POA: Diagnosis not present

## 2022-04-02 ENCOUNTER — Encounter: Payer: Self-pay | Admitting: Cardiology

## 2022-04-02 ENCOUNTER — Ambulatory Visit: Payer: BC Managed Care – PPO | Attending: Cardiology | Admitting: Cardiology

## 2022-04-02 VITALS — BP 120/68 | HR 72 | Ht 69.5 in | Wt 223.8 lb

## 2022-04-02 DIAGNOSIS — I2510 Atherosclerotic heart disease of native coronary artery without angina pectoris: Secondary | ICD-10-CM | POA: Diagnosis not present

## 2022-04-02 DIAGNOSIS — I519 Heart disease, unspecified: Secondary | ICD-10-CM

## 2022-04-02 DIAGNOSIS — I10 Essential (primary) hypertension: Secondary | ICD-10-CM

## 2022-04-02 DIAGNOSIS — E785 Hyperlipidemia, unspecified: Secondary | ICD-10-CM | POA: Diagnosis not present

## 2022-04-02 LAB — BASIC METABOLIC PANEL
BUN/Creatinine Ratio: 10 (ref 9–20)
BUN: 10 mg/dL (ref 6–24)
CO2: 24 mmol/L (ref 20–29)
Calcium: 9.9 mg/dL (ref 8.7–10.2)
Chloride: 103 mmol/L (ref 96–106)
Creatinine, Ser: 0.98 mg/dL (ref 0.76–1.27)
Glucose: 129 mg/dL — ABNORMAL HIGH (ref 70–99)
Potassium: 4.7 mmol/L (ref 3.5–5.2)
Sodium: 140 mmol/L (ref 134–144)
eGFR: 89 mL/min/{1.73_m2} (ref >59)

## 2022-04-02 LAB — LIPID PANEL
Chol/HDL Ratio: 2.8 ratio (ref 0.0–5.0)
Cholesterol, Total: 85 mg/dL — ABNORMAL LOW (ref 100–199)
HDL: 30 mg/dL — ABNORMAL LOW (ref >39)
LDL Chol Calc (NIH): 37 mg/dL (ref 0–99)
Triglycerides: 88 mg/dL (ref 0–149)
VLDL Cholesterol Cal: 18 mg/dL (ref 5–40)

## 2022-04-02 NOTE — Progress Notes (Signed)
Cardiology Office Note:    Date:  04/02/2022   ID:  JT BRABEC, DOB 1965/10/14, MRN [id number]  PCP:  Nathaniel Funk, MD  Cardiologist:  None  Electrophysiologist:  None   Referring MD: Nathaniel Funk, MD   Chief Complaint  Patient presents with   Coronary Artery Disease    History of Present Illness:    Nathaniel Conley is a 56 y.o. male with a hx of T2DM, hyperlipidemia, hypertension, OSA who presents for follow-up.  He was referred by Dr. Valentina Lucks for evaluation of CAD, initially seen on 07/05/2020.  Calcium score on 06/17/2020 was 963 (98th percentile).  Also noted to have dilated ascending aorta measuring 40 mm.  He does report occasional chest pain.  Describes as dull aching pain in center of his chest, occurs when he is under significant stress.  Occurs about once per month.  Has not noted any exertional chest pain.  He denies any dyspnea.  Reports chest pain typically last for 5 to 10 minutes.  Has had some lightheadedness recently with dietary changes, denies any syncope.  Has chronic headaches.  He denies any palpitations or lower extremity edema.  Does not exercise, has been limited by hip pain.  No smoking history.  Family history includes paternal grandfather died of MI at age 60 and father had MI at age 67.  Lexiscan Myoview on 07/15/2020 showed probably normal perfusion with mild diaphragmatic attenuation, no evidence of ischemia, LVEF 46%.  Echocardiogram on 07/22/2020 showed prominent trabeculations, consider noncompaction, LVEF 50 to 55%, normal RV function, no significant valvular disease.  Cardiac MRI on 10/12/2020 showed mild LV dilatation with mild systolic dysfunction (EF 48%), LV hypertrabeculation consistent with LV noncompaction, no LGE.  Zio patch x7 days on 03/02/2021 showed no significant arrhythmias.  Since last clinic visit, he reports he has been doing well.  Has started Ozempic.  Has lost 30 pounds.  Denies any chest pain, dyspnea, lightheadedness, syncope, lower  extremity edema, or palpitations.  Has not been exercising.   Wt Readings from Last 3 Encounters:  04/02/22 223 lb 12.8 oz (101.5 kg)  10/16/21 250 lb 6.4 oz (113.6 kg)  02/10/21 240 lb (108.9 kg)   '  Past Medical History:  Diagnosis Date   Chronic headaches    " 24-7" headache Pt. stated.   Diabetes mellitus without complication (HCC)    High cholesterol    History of kidney stones    Hypertension    Sleep apnea    uses bipap    Past Surgical History:  Procedure Laterality Date   EXTRACORPOREAL SHOCK WAVE LITHOTRIPSY Right 01/27/2018   Procedure: RIGHT EXTRACORPOREAL SHOCK WAVE LITHOTRIPSY (ESWL);  Surgeon: Ihor Gully, MD;  Location: [facility];  Service: Urology;  Laterality: Right;   WISDOM TOOTH EXTRACTION     age [age]    Current Medications: Current Meds  Medication Sig   aspirin 81 MG chewable tablet Chew 81 mg by mouth daily.   atorvastatin (LIPITOR) 80 MG tablet Take 80 mg by mouth daily.   carvedilol (COREG) 12.5 MG tablet Take 1 tablet (12.5 mg total) by mouth 2 (two) times daily.   FARXIGA 5 MG TABS tablet Take 5 mg by mouth daily.   glimepiride (AMARYL) 4 MG tablet Take 4 mg by mouth daily.   metFORMIN (GLUCOPHAGE) 1000 MG tablet    OZEMPIC, 2 MG/DOSE, 8 MG/3ML SOPN Inject into the skin.   telmisartan (MICARDIS) 80 MG tablet Take 80 mg by mouth daily.  Allergies:   Patient has no known allergies.   Social History   Socioeconomic History   Marital status: Married    Spouse name: Not on file   Number of children: Not on file   Years of education: Not on file   Highest education level: Not on file  Occupational History   Not on file  Tobacco Use   Smoking status: Never   Smokeless tobacco: Never  Substance and Sexual Activity   Alcohol use: No    Alcohol/week: 0.0 standard drinks of alcohol   Drug use: No   Sexual activity: Not on file  Other Topics Concern   Not on file  Social History Narrative   Not on file   Social Determinants of  Health   Financial Resource Strain: Not on file  Food Insecurity: Not on file  Transportation Needs: Not on file  Physical Activity: Not on file  Stress: Not on file  Social Connections: Not on file     Family History: Paternal grandfather MI at 49, father MI at age 60  ROS:   Please see the history of present illness.     All other systems reviewed and are negative.  EKGs/Labs/Other Studies Reviewed:    The following studies were reviewed today:   EKG:   04/02/22: NSR, rate 72, TWI in leads V4-6  Recent Labs: 11/16/2021: ALT 37  Recent Lipid Panel    Component Value Date/Time   CHOL 93 (L) 09/16/2020 0936   TRIG 66 09/16/2020 0936   HDL 33 (L) 09/16/2020 0936   CHOLHDL 2.8 09/16/2020 0936   LDLCALC 45 09/16/2020 0936    Physical Exam:    VS:  BP 120/68 (BP Location: Left Arm, Patient Position: Sitting, Cuff Size: Normal)   Pulse 72   Ht 5' 9.5" (1.765 m)   Wt 223 lb 12.8 oz (101.5 kg)   SpO2 99%   BMI 32.58 kg/m     Wt Readings from Last 3 Encounters:  04/02/22 223 lb 12.8 oz (101.5 kg)  10/16/21 250 lb 6.4 oz (113.6 kg)  02/10/21 240 lb (108.9 kg)     GEN: Well nourished, well developed in no acute distress HEENT: Normal NECK: No JVD; No carotid bruits LYMPHATICS: No lymphadenopathy CARDIAC: RRR, no murmurs, rubs, gallops RESPIRATORY:  Clear to auscultation without rales, wheezing or rhonchi  ABDOMEN: Soft, non-tender, non-distended MUSCULOSKELETAL:  Trace edema; No deformity  SKIN: Warm and dry NEUROLOGIC:  Alert and oriented x 3 PSYCHIATRIC:  Normal affect   ASSESSMENT:    1. Coronary artery disease involving native coronary artery of native heart without angina pectoris   2. Systolic dysfunction   3. Essential hypertension   4. Hyperlipidemia, unspecified hyperlipidemia type     PLAN:    CAD: Calcium score on 06/17/2020 was 963 (98th percentile).  Reports atypical chest pain.  Lexiscan Myoview on 07/15/2020 showed probably normal perfusion  with mild diaphragmatic attenuation, no evidence of ischemia, LVEF 46%.  Echocardiogram on 07/22/2020 showed prominent trabeculations, consider noncompaction, LVEF 50 to 55%, normal RV function, no significant valvular disease. -Continue aspirin 81 mg daily -Continue atorvastatin 80 mg daily  Systolic dysfunction: Echocardiogram on 07/22/2020 showed prominent trabeculations, consider noncompaction, LVEF 50 to 55%, normal RV function, no significant valvular disease.  Cardiac MRI on 10/12/2020 showed mild LV dilatation with mild systolic dysfunction (EF 48%), LV hypertrabeculation consistent with LV noncompaction, no LGE. -Reports was switched from lisinopril to telmisartan by PCP but he is unsure of his dose.  Asked him to call when he gets home and let us know what dose he is taking -Continue carvedilol 12.5 mg twice daily -Continue Farxiga -Have discussed genetic testing, he is not interested  -Zio patch x7 days on 03/02/2021 showed no significant arrhythmias.  Hyperlipidemia: On atorvastatin 80 mg daily.  LDL 76 on 06/09/2021, above goal LDL less than 70.  He has since lost 30 pounds, will recheck lipid panel  Hypertension: On telmisartan and Coreg 12.5 mg twice daily.  Appears controlled  Aortic dilatation: Dilated ascending aorta measuring 40 mm on calcium score on 06/17/2020.  CTA chest 10/2021 showed normal caliber aorta  T2DM: on Ozempic and Farxiga.  A1c 9.0% 03/02/2022.  OSA: on Bipap, using nightly  Obesity: Body mass index is 32.58 kg/m.  On Ozempic, has lost 30 pounds in last 6 months  RTC in 6 months     Medication Adjustments/Labs and Tests Ordered: Current medicines are reviewed at length with the patient today.  Concerns regarding medicines are outlined above.  Orders Placed This Encounter  Procedures   Basic metabolic panel   Lipid panel    No orders of the defined types were placed in this encounter.    Patient Instructions  Medication Instructions:  Call when you  get home to confirm medication and doses  *If you need a refill on your cardiac medications before your next appointment, please call your pharmacy*   Lab Work: Lipid, BMET today  If you have labs (blood work) drawn today and your tests are completely normal, you will receive your results only by: MyChart Message (if you have MyChart) OR A paper copy in the mail If you have any lab test that is abnormal or we need to change your treatment, we will call you to review the results.   Follow-Up: At [facility], you and your health needs are our priority.  As part of our continuing mission to provide you with exceptional heart care, we have created designated Provider Care Teams.  These Care Teams include your primary Cardiologist (physician) and Advanced Practice Providers (APPs -  Physician Assistants and Nurse Practitioners) who all work together to provide you with the care you need, when you need it.  We recommend signing up for the patient portal called "MyChart".  Sign up information is provided on this After Visit Summary.  MyChart is used to connect with patients for Virtual Visits (Telemedicine).  Patients are able to view lab/test results, encounter notes, upcoming appointments, etc.  Non-urgent messages can be sent to your provider as well.   To learn more about what you can do with MyChart, go to [email or web address].    Your next appointment:   6 month(s)  The format for your next appointment:   In Person  Provider:   Dr. Bjorn Pippin       Signed, Little Ishikawa, MD  04/02/2022 9:47 AM    [facility]

## 2022-04-02 NOTE — Patient Instructions (Signed)
Medication Instructions:  Call when you get home to confirm medication and doses  *If you need a refill on your cardiac medications before your next appointment, please call your pharmacy*   Lab Work: Lipid, BMET today  If you have labs (blood work) drawn today and your tests are completely normal, you will receive your results only by: MyChart Message (if you have MyChart) OR A paper copy in the mail If you have any lab test that is abnormal or we need to change your treatment, we will call you to review the results.   Follow-Up: At [facility], you and your health needs are our priority.  As part of our continuing mission to provide you with exceptional heart care, we have created designated Provider Care Teams.  These Care Teams include your primary Cardiologist (physician) and Advanced Practice Providers (APPs -  Physician Assistants and Nurse Practitioners) who all work together to provide you with the care you need, when you need it.  We recommend signing up for the patient portal called "MyChart".  Sign up information is provided on this After Visit Summary.  MyChart is used to connect with patients for Virtual Visits (Telemedicine).  Patients are able to view lab/test results, encounter notes, upcoming appointments, etc.  Non-urgent messages can be sent to your provider as well.   To learn more about what you can do with MyChart, go to [email or web address].    Your next appointment:   6 month(s)  The format for your next appointment:   In Person  Provider:   Dr. Bjorn Pippin

## 2022-04-06 ENCOUNTER — Encounter: Payer: Self-pay | Admitting: *Deleted

## 2022-04-11 DIAGNOSIS — G4452 New daily persistent headache (NDPH): Secondary | ICD-10-CM | POA: Diagnosis not present

## 2022-04-18 DIAGNOSIS — E1142 Type 2 diabetes mellitus with diabetic polyneuropathy: Secondary | ICD-10-CM | POA: Diagnosis not present

## 2022-04-18 DIAGNOSIS — E7800 Pure hypercholesterolemia, unspecified: Secondary | ICD-10-CM | POA: Diagnosis not present

## 2022-04-18 DIAGNOSIS — I10 Essential (primary) hypertension: Secondary | ICD-10-CM | POA: Diagnosis not present

## 2022-04-18 DIAGNOSIS — Z125 Encounter for screening for malignant neoplasm of prostate: Secondary | ICD-10-CM | POA: Diagnosis not present

## 2022-05-01 DIAGNOSIS — G4733 Obstructive sleep apnea (adult) (pediatric): Secondary | ICD-10-CM | POA: Diagnosis not present

## 2022-05-17 DIAGNOSIS — H02831 Dermatochalasis of right upper eyelid: Secondary | ICD-10-CM | POA: Diagnosis not present

## 2022-05-21 DIAGNOSIS — I10 Essential (primary) hypertension: Secondary | ICD-10-CM | POA: Diagnosis not present

## 2022-05-21 DIAGNOSIS — I700 Atherosclerosis of aorta: Secondary | ICD-10-CM | POA: Diagnosis not present

## 2022-05-21 DIAGNOSIS — E559 Vitamin D deficiency, unspecified: Secondary | ICD-10-CM | POA: Diagnosis not present

## 2022-05-21 DIAGNOSIS — E538 Deficiency of other specified B group vitamins: Secondary | ICD-10-CM | POA: Diagnosis not present

## 2022-05-21 DIAGNOSIS — E1165 Type 2 diabetes mellitus with hyperglycemia: Secondary | ICD-10-CM | POA: Diagnosis not present

## 2022-08-03 DIAGNOSIS — Z01818 Encounter for other preprocedural examination: Secondary | ICD-10-CM | POA: Diagnosis not present

## 2022-08-20 DIAGNOSIS — E1142 Type 2 diabetes mellitus with diabetic polyneuropathy: Secondary | ICD-10-CM | POA: Diagnosis not present

## 2022-08-20 DIAGNOSIS — E291 Testicular hypofunction: Secondary | ICD-10-CM | POA: Diagnosis not present

## 2022-08-20 DIAGNOSIS — E1165 Type 2 diabetes mellitus with hyperglycemia: Secondary | ICD-10-CM | POA: Diagnosis not present

## 2022-08-20 DIAGNOSIS — I10 Essential (primary) hypertension: Secondary | ICD-10-CM | POA: Diagnosis not present

## 2022-08-20 DIAGNOSIS — I25118 Atherosclerotic heart disease of native coronary artery with other forms of angina pectoris: Secondary | ICD-10-CM | POA: Diagnosis not present

## 2022-10-07 NOTE — Progress Notes (Signed)
Cardiology Office Note:    Date:  10/08/2022   ID:  Nathaniel Conley, DOB Oct 28, 1965, MRN [id number]  PCP:  Emilio Aspen, MD  Cardiologist:  None  Electrophysiologist:  None   Referring MD: Kirby Funk, MD   Chief Complaint  Patient presents with   Congestive Heart Failure    History of Present Illness:    Nathaniel Conley is a 57 y.o. male with a hx of T2DM, hyperlipidemia, hypertension, OSA who presents for follow-up.  He was referred by Dr. Valentina Lucks for evaluation of CAD, initially seen on 07/05/2020.  Calcium score on 06/17/2020 was 963 (98th percentile).  Also noted to have dilated ascending aorta measuring 40 mm.  He does report occasional chest pain.  Describes as dull aching pain in center of his chest, occurs when he is under significant stress.  Occurs about once per month.  Has not noted any exertional chest pain.  He denies any dyspnea.  Reports chest pain typically last for 5 to 10 minutes.  Has had some lightheadedness recently with dietary changes, denies any syncope.  Has chronic headaches.  He denies any palpitations or lower extremity edema.  Does not exercise, has been limited by hip pain.  No smoking history.  Family history includes paternal grandfather died of MI at age 25 and father had MI at age 62.  Lexiscan Myoview on 07/15/2020 showed probably normal perfusion with mild diaphragmatic attenuation, no evidence of ischemia, LVEF 46%.  Echocardiogram on 07/22/2020 showed prominent trabeculations, consider noncompaction, LVEF 50 to 55%, normal RV function, no significant valvular disease.  Cardiac MRI on 10/12/2020 showed mild LV dilatation with mild systolic dysfunction (EF 48%), LV hypertrabeculation consistent with LV noncompaction, no LGE.  Zio patch x7 days on 03/02/2021 showed no significant arrhythmias.  Since last clinic visit, he reports he is doing well.  Reports rare chest pain lasting for few seconds.  He has lost 65 pounds over the last year.  He walks twice  per day for 20 minutes each time as well as lifts weights.  Denies any dyspnea, lightheadedness, syncope, lower extremity edema, or palpitations.   Wt Readings from Last 3 Encounters:  10/08/22 186 lb 12.8 oz (84.7 kg)  04/02/22 223 lb 12.8 oz (101.5 kg)  10/16/21 250 lb 6.4 oz (113.6 kg)   '  Past Medical History:  Diagnosis Date   Chronic headaches    " 24-7" headache Pt. stated.   Diabetes mellitus without complication (HCC)    High cholesterol    History of kidney stones    Hypertension    Sleep apnea    uses bipap    Past Surgical History:  Procedure Laterality Date   EXTRACORPOREAL SHOCK WAVE LITHOTRIPSY Right 01/27/2018   Procedure: RIGHT EXTRACORPOREAL SHOCK WAVE LITHOTRIPSY (ESWL);  Surgeon: Ihor Gully, MD;  Location: [facility];  Service: Urology;  Laterality: Right;   WISDOM TOOTH EXTRACTION     age 10-17    Current Medications: Current Meds  Medication Sig   amLODipine (NORVASC) 5 MG tablet Take 5 mg by mouth daily.   aspirin 81 MG chewable tablet Chew 81 mg by mouth daily.   atorvastatin (LIPITOR) 80 MG tablet Take 80 mg by mouth daily.   carvedilol (COREG) 12.5 MG tablet Take 1 tablet (12.5 mg total) by mouth 2 (two) times daily.   FARXIGA 5 MG TABS tablet Take 5 mg by mouth daily.   metFORMIN (GLUCOPHAGE) 1000 MG tablet 1,000 mg 2 (two) times daily.  OZEMPIC, 2 MG/DOSE, 8 MG/3ML SOPN Inject into the skin.   telmisartan (MICARDIS) 80 MG tablet Take 80 mg by mouth daily.     Allergies:   Patient has no known allergies.   Social History   Socioeconomic History   Marital status: Married    Spouse name: Not on file   Number of children: Not on file   Years of education: Not on file   Highest education level: Not on file  Occupational History   Not on file  Tobacco Use   Smoking status: Never   Smokeless tobacco: Never  Substance and Sexual Activity   Alcohol use: No    Alcohol/week: 0.0 standard drinks of alcohol   Drug use: No   Sexual  activity: Not on file  Other Topics Concern   Not on file  Social History Narrative   Not on file   Social Determinants of Health   Financial Resource Strain: Not on file  Food Insecurity: Not on file  Transportation Needs: Not on file  Physical Activity: Not on file  Stress: Not on file  Social Connections: Not on file     Family History: Paternal grandfather MI at 22, father MI at age 3  ROS:   Please see the history of present illness.     All other systems reviewed and are negative.  EKGs/Labs/Other Studies Reviewed:    The following studies were reviewed today:   EKG:   04/02/22: NSR, rate 72, TWI in leads V4-6 10/08/22: NSR, rate 65, nonspecific T wave flattening  Recent Labs: 11/16/2021: ALT 37 04/02/2022: BUN 10; Creatinine, Ser 0.98; Potassium 4.7; Sodium 140  Recent Lipid Panel    Component Value Date/Time   CHOL 85 (L) 04/02/2022 0918   TRIG 88 04/02/2022 0918   HDL 30 (L) 04/02/2022 0918   CHOLHDL 2.8 04/02/2022 0918   LDLCALC 37 04/02/2022 0918    Physical Exam:    VS:  BP 114/74 (BP Location: Left Arm, Patient Position: Sitting, Cuff Size: Normal)   Pulse 65   Ht 5' 9.5" (1.765 m)   Wt 186 lb 12.8 oz (84.7 kg)   SpO2 97%   BMI 27.19 kg/m     Wt Readings from Last 3 Encounters:  10/08/22 186 lb 12.8 oz (84.7 kg)  04/02/22 223 lb 12.8 oz (101.5 kg)  10/16/21 250 lb 6.4 oz (113.6 kg)     GEN: Well nourished, well developed in no acute distress HEENT: Normal NECK: No JVD; No carotid bruits LYMPHATICS: No lymphadenopathy CARDIAC: RRR, no murmurs, rubs, gallops RESPIRATORY:  Clear to auscultation without rales, wheezing or rhonchi  ABDOMEN: Soft, non-tender, non-distended MUSCULOSKELETAL:  Trace edema; No deformity  SKIN: Warm and dry NEUROLOGIC:  Alert and oriented x 3 PSYCHIATRIC:  Normal affect   ASSESSMENT:    1. Coronary artery disease involving native coronary artery of native heart without angina pectoris   2. Systolic  dysfunction   3. Aortic dilatation (HCC)   4. Noncompaction cardiomyopathy (HCC)   5. Essential hypertension   6. Hyperlipidemia, unspecified hyperlipidemia type     PLAN:    CAD: Calcium score on 06/17/2020 was 963 (98th percentile).  Reports atypical chest pain.  Lexiscan Myoview on 07/15/2020 showed probably normal perfusion with mild diaphragmatic attenuation, no evidence of ischemia, LVEF 46%.  Echocardiogram on 07/22/2020 showed prominent trabeculations, consider noncompaction, LVEF 50 to 55%, normal RV function, no significant valvular disease. -Continue aspirin 81 mg daily -Continue atorvastatin 80 mg daily  LV noncompaction  cardiomyopathy: Echocardiogram on 07/22/2020 showed prominent trabeculations, consider noncompaction, LVEF 50 to 55%, normal RV function, no significant valvular disease.  Cardiac MRI on 10/12/2020 showed mild LV dilatation with mild systolic dysfunction (EF 48%), LV hypertrabeculation consistent with LV noncompaction, no LGE. -Continue telmisartan 80 mg daily -Continue carvedilol 12.5 mg twice daily -Continue Farxiga -Have discussed genetic testing, he is not interested  -Zio patch x7 days on 03/02/2021 showed no significant arrhythmias. -Repeat echocardiogram prior to next clinic appointment  Hyperlipidemia: On atorvastatin 80 mg daily.  LDL 76 on 06/09/2021, above goal LDL less than 70.  He has since lost 65 lbs, LDL down to 36 04/17/22  Hypertension: On telmisartan and Coreg 12.5 mg twice daily.  Appears controlled  Aortic dilatation: Dilated ascending aorta measuring 40 mm on calcium score on 06/17/2020.  CTA chest 10/2021 showed normal caliber aorta  T2DM: on Ozempic and Farxiga.  A1c 9.0% 03/02/2022. Down to 5.9% on 08/20/22  OSA: on Bipap, using nightly.  Suspect OSA has improved with his weight loss, encouraged him to follow-up with sleep medicine  Obesity: Body mass index is 27.19 kg/m.  On Ozempic, has lost 65 pounds in last year  RTC in 6  months     Medication Adjustments/Labs and Tests Ordered: Current medicines are reviewed at length with the patient today.  Concerns regarding medicines are outlined above.  Orders Placed This Encounter  Procedures   EKG 12-Lead   ECHOCARDIOGRAM COMPLETE    No orders of the defined types were placed in this encounter.    Patient Instructions  Medication Instructions:  Continue medications *If you need a refill on your cardiac medications before your next appointment, please call your pharmacy*   Lab Work: None ordered   Testing/Procedures: Schedule Echo in 04/2023   Follow-Up: At [facility], you and your health needs are our priority.  As part of our continuing mission to provide you with exceptional heart care, we have created designated Provider Care Teams.  These Care Teams include your primary Cardiologist (physician) and Advanced Practice Providers (APPs -  Physician Assistants and Nurse Practitioners) who all work together to provide you with the care you need, when you need it.  We recommend signing up for the patient portal called "MyChart".  Sign up information is provided on this After Visit Summary.  MyChart is used to connect with patients for Virtual Visits (Telemedicine).  Patients are able to view lab/test results, encounter notes, upcoming appointments, etc.  Non-urgent messages can be sent to your provider as well.   To learn more about what you can do with MyChart, go to [email or web address].    Your next appointment:  04/2023 after Echo    Provider:  Dr.Joceline Hinchcliff     Signed, Little Ishikawa, MD  10/08/2022 8:47 AM    [facility]

## 2022-10-08 ENCOUNTER — Encounter: Payer: Self-pay | Admitting: Cardiology

## 2022-10-08 ENCOUNTER — Ambulatory Visit: Payer: BC Managed Care – PPO | Attending: Cardiology | Admitting: Cardiology

## 2022-10-08 VITALS — BP 114/74 | HR 65 | Ht 69.5 in | Wt 186.8 lb

## 2022-10-08 DIAGNOSIS — I428 Other cardiomyopathies: Secondary | ICD-10-CM

## 2022-10-08 DIAGNOSIS — I2510 Atherosclerotic heart disease of native coronary artery without angina pectoris: Secondary | ICD-10-CM

## 2022-10-08 DIAGNOSIS — I10 Essential (primary) hypertension: Secondary | ICD-10-CM

## 2022-10-08 DIAGNOSIS — I77819 Aortic ectasia, unspecified site: Secondary | ICD-10-CM | POA: Diagnosis not present

## 2022-10-08 DIAGNOSIS — E785 Hyperlipidemia, unspecified: Secondary | ICD-10-CM

## 2022-10-08 DIAGNOSIS — I519 Heart disease, unspecified: Secondary | ICD-10-CM

## 2022-10-08 NOTE — Patient Instructions (Signed)
Medication Instructions:  Continue medications *If you need a refill on your cardiac medications before your next appointment, please call your pharmacy*   Lab Work: None ordered   Testing/Procedures: Schedule Echo in 04/2023   Follow-Up: At [facility], you and your health needs are our priority.  As part of our continuing mission to provide you with exceptional heart care, we have created designated Provider Care Teams.  These Care Teams include your primary Cardiologist (physician) and Advanced Practice Providers (APPs -  Physician Assistants and Nurse Practitioners) who all work together to provide you with the care you need, when you need it.  We recommend signing up for the patient portal called "MyChart".  Sign up information is provided on this After Visit Summary.  MyChart is used to connect with patients for Virtual Visits (Telemedicine).  Patients are able to view lab/test results, encounter notes, upcoming appointments, etc.  Non-urgent messages can be sent to your provider as well.   To learn more about what you can do with MyChart, go to [email or web address].    Your next appointment:  04/2023 after Echo    Provider:  Dr.Schumann

## 2022-10-23 DIAGNOSIS — G4733 Obstructive sleep apnea (adult) (pediatric): Secondary | ICD-10-CM | POA: Diagnosis not present

## 2022-10-23 DIAGNOSIS — I25118 Atherosclerotic heart disease of native coronary artery with other forms of angina pectoris: Secondary | ICD-10-CM | POA: Diagnosis not present

## 2022-10-23 DIAGNOSIS — I10 Essential (primary) hypertension: Secondary | ICD-10-CM | POA: Diagnosis not present

## 2022-10-23 DIAGNOSIS — E1165 Type 2 diabetes mellitus with hyperglycemia: Secondary | ICD-10-CM | POA: Diagnosis not present

## 2022-11-07 ENCOUNTER — Other Ambulatory Visit: Payer: Self-pay | Admitting: Cardiology

## 2023-03-12 DIAGNOSIS — H53143 Visual discomfort, bilateral: Secondary | ICD-10-CM | POA: Diagnosis not present

## 2023-03-12 DIAGNOSIS — E119 Type 2 diabetes mellitus without complications: Secondary | ICD-10-CM | POA: Diagnosis not present

## 2023-04-16 ENCOUNTER — Encounter: Payer: Self-pay | Admitting: Cardiology

## 2023-04-16 NOTE — Telephone Encounter (Signed)
Error

## 2023-04-19 ENCOUNTER — Ambulatory Visit (HOSPITAL_COMMUNITY): Payer: BC Managed Care – PPO | Attending: Cardiovascular Disease

## 2023-04-19 DIAGNOSIS — I519 Heart disease, unspecified: Secondary | ICD-10-CM | POA: Diagnosis not present

## 2023-04-19 DIAGNOSIS — I2510 Atherosclerotic heart disease of native coronary artery without angina pectoris: Secondary | ICD-10-CM | POA: Diagnosis not present

## 2023-04-19 DIAGNOSIS — I77819 Aortic ectasia, unspecified site: Secondary | ICD-10-CM | POA: Diagnosis not present

## 2023-04-19 LAB — ECHOCARDIOGRAM COMPLETE
Area-P 1/2: 3.37 cm2
S' Lateral: 3.7 cm

## 2023-04-24 DIAGNOSIS — G4733 Obstructive sleep apnea (adult) (pediatric): Secondary | ICD-10-CM | POA: Diagnosis not present

## 2023-04-24 DIAGNOSIS — Z125 Encounter for screening for malignant neoplasm of prostate: Secondary | ICD-10-CM | POA: Diagnosis not present

## 2023-04-24 DIAGNOSIS — I700 Atherosclerosis of aorta: Secondary | ICD-10-CM | POA: Diagnosis not present

## 2023-04-24 DIAGNOSIS — I10 Essential (primary) hypertension: Secondary | ICD-10-CM | POA: Diagnosis not present

## 2023-04-24 DIAGNOSIS — Z79899 Other long term (current) drug therapy: Secondary | ICD-10-CM | POA: Diagnosis not present

## 2023-04-24 DIAGNOSIS — E1165 Type 2 diabetes mellitus with hyperglycemia: Secondary | ICD-10-CM | POA: Diagnosis not present

## 2023-04-24 DIAGNOSIS — Z23 Encounter for immunization: Secondary | ICD-10-CM | POA: Diagnosis not present

## 2023-04-24 DIAGNOSIS — Z0000 Encounter for general adult medical examination without abnormal findings: Secondary | ICD-10-CM | POA: Diagnosis not present

## 2023-04-24 DIAGNOSIS — E7800 Pure hypercholesterolemia, unspecified: Secondary | ICD-10-CM | POA: Diagnosis not present

## 2023-05-06 ENCOUNTER — Emergency Department (HOSPITAL_BASED_OUTPATIENT_CLINIC_OR_DEPARTMENT_OTHER)
Admission: EM | Admit: 2023-05-06 | Discharge: 2023-05-06 | Disposition: A | Discharge status: Home / Self Care | Payer: BC Managed Care – PPO | Attending: Emergency Medicine | Admitting: Emergency Medicine

## 2023-05-06 ENCOUNTER — Emergency Department (HOSPITAL_BASED_OUTPATIENT_CLINIC_OR_DEPARTMENT_OTHER): Payer: BC Managed Care – PPO

## 2023-05-06 ENCOUNTER — Other Ambulatory Visit: Payer: Self-pay

## 2023-05-06 ENCOUNTER — Encounter (HOSPITAL_BASED_OUTPATIENT_CLINIC_OR_DEPARTMENT_OTHER): Payer: Self-pay | Admitting: Emergency Medicine

## 2023-05-06 DIAGNOSIS — Z7982 Long term (current) use of aspirin: Secondary | ICD-10-CM | POA: Diagnosis not present

## 2023-05-06 DIAGNOSIS — Z79899 Other long term (current) drug therapy: Secondary | ICD-10-CM | POA: Insufficient documentation

## 2023-05-06 DIAGNOSIS — R1032 Left lower quadrant pain: Secondary | ICD-10-CM | POA: Insufficient documentation

## 2023-05-06 DIAGNOSIS — N200 Calculus of kidney: Secondary | ICD-10-CM | POA: Diagnosis not present

## 2023-05-06 DIAGNOSIS — K8020 Calculus of gallbladder without cholecystitis without obstruction: Secondary | ICD-10-CM | POA: Diagnosis not present

## 2023-05-06 DIAGNOSIS — Z7984 Long term (current) use of oral hypoglycemic drugs: Secondary | ICD-10-CM | POA: Insufficient documentation

## 2023-05-06 DIAGNOSIS — E119 Type 2 diabetes mellitus without complications: Secondary | ICD-10-CM | POA: Insufficient documentation

## 2023-05-06 DIAGNOSIS — R109 Unspecified abdominal pain: Secondary | ICD-10-CM

## 2023-05-06 DIAGNOSIS — I10 Essential (primary) hypertension: Secondary | ICD-10-CM | POA: Diagnosis not present

## 2023-05-06 LAB — URINALYSIS, ROUTINE W REFLEX MICROSCOPIC
Bacteria, UA: NONE SEEN
Bilirubin Urine: NEGATIVE
Glucose, UA: >1000 mg/dL — AB
Ketones, ur: NEGATIVE mg/dL
Leukocytes,Ua: NEGATIVE
Nitrite: NEGATIVE
Protein, ur: NEGATIVE mg/dL
Specific Gravity, Urine: 1.022 (ref 1.005–1.030)
pH: 6.5 (ref 5.0–8.0)

## 2023-05-06 LAB — BASIC METABOLIC PANEL
Anion gap: 9 (ref 5–15)
BUN: 11 mg/dL (ref 6–20)
CO2: 26 mmol/L (ref 22–32)
Calcium: 9.7 mg/dL (ref 8.9–10.3)
Chloride: 104 mmol/L (ref 98–111)
Creatinine, Ser: 0.88 mg/dL (ref 0.61–1.24)
GFR, Estimated: >60 mL/min (ref >60)
Glucose, Bld: 154 mg/dL — ABNORMAL HIGH (ref 70–99)
Potassium: 4.7 mmol/L (ref 3.5–5.1)
Sodium: 139 mmol/L (ref 135–145)

## 2023-05-06 LAB — HEPATIC FUNCTION PANEL
ALT: 22 U/L (ref 0–44)
AST: 20 U/L (ref 15–41)
Albumin: 4.7 g/dL (ref 3.5–5.0)
Alkaline Phosphatase: 64 U/L (ref 38–126)
Bilirubin, Direct: 0.1 mg/dL (ref 0.0–0.2)
Indirect Bilirubin: 0.5 mg/dL (ref 0.3–0.9)
Total Bilirubin: 0.6 mg/dL (ref 0.0–1.2)
Total Protein: 8.2 g/dL — ABNORMAL HIGH (ref 6.5–8.1)

## 2023-05-06 LAB — CBC
HCT: 44.9 % (ref 39.0–52.0)
Hemoglobin: 15.3 g/dL (ref 13.0–17.0)
MCH: 31.9 pg (ref 26.0–34.0)
MCHC: 34.1 g/dL (ref 30.0–36.0)
MCV: 93.5 fL (ref 80.0–100.0)
Platelets: 217 10*3/uL (ref 150–400)
RBC: 4.8 MIL/uL (ref 4.22–5.81)
RDW: 12.9 % (ref 11.5–15.5)
WBC: 7.6 10*3/uL (ref 4.0–10.5)
nRBC: 0 % (ref 0.0–0.2)

## 2023-05-06 MED ORDER — IOHEXOL 300 MG/ML  SOLN
100.0000 mL | Freq: Once | INTRAMUSCULAR | Status: AC | PRN
  Administered 2023-05-06: 100 mL via INTRAVENOUS

## 2023-05-06 NOTE — ED Provider Notes (Signed)
[facility] Provider Note   CSN: [id number] Arrival date & time: 05/06/23  1331     History  Chief Complaint  Patient presents with   Flank Pain    Nathaniel Conley is a 58 y.o. male.   Flank Pain   58 year old male presents emergency department with complaints of left lower quadrant abdominal pain.  Patient reports symptoms beginning around 9 AM this morning "out of nowhere."  States he went to a walk-in clinic where they gave him Tylenol which seemed to improve his symptoms prior to coming emergency department.  States he has a history of right-sided kidney stones and is unsure whether or not this feels the same.  States he feels like this pain is more intense than the last time.  Denies any fever, nausea, vomiting or urinary symptoms, change in bowel habits.  Last bowel movement this morning and regular per patient.  States that when he had the prior kidney stone it was 10+ years ago and had received lithotripsy.  Past medical history significant for hypertension, hyperlipidemia, diabetes mellitus, kidney stone, OSA, chronic headache  Home Medications Prior to Admission medications   Medication Sig Start Date End Date Taking? Authorizing Provider  amLODipine (NORVASC) 5 MG tablet Take 5 mg by mouth daily. 10/31/21   [provider]  aspirin 81 MG chewable tablet Chew 81 mg by mouth daily.    [provider]  atorvastatin (LIPITOR) 80 MG tablet Take 80 mg by mouth daily. 06/18/20   [provider]  carvedilol (COREG) 12.5 MG tablet TAKE 1 TABLET BY MOUTH 2 TIMES DAILY. 11/07/22   Little Ishikawa, MD  FARXIGA 5 MG TABS tablet Take 5 mg by mouth daily. 01/24/22   [provider]  glimepiride (AMARYL) 4 MG tablet Take 4 mg by mouth daily. Patient not taking: Reported on 10/08/2022 06/21/20   [provider]  metFORMIN (GLUCOPHAGE) 1000 MG tablet 1,000 mg 2 (two) times daily. 06/15/15   [provider]  OZEMPIC, 2 MG/DOSE, 8 MG/3ML SOPN Inject into the skin. 02/26/22   [provider]  telmisartan (MICARDIS) 80 MG tablet Take 80 mg by mouth daily. 01/18/22   [provider]  TRULICITY 3 MG/0.5ML SOPN  05/26/20   [provider]      Allergies    Patient has no known allergies.    Review of Systems   Review of Systems  Genitourinary:  Positive for flank pain.  All other systems reviewed and are negative.   Physical Exam Updated Vital Signs BP 126/68   Pulse 80   Temp (!) 97.5 F (36.4 C)   Resp 18   Ht 5\' 9"  (1.753 m)   Wt 90.7 kg   SpO2 99%   BMI 29.53 kg/m  Physical Exam Vitals and nursing note reviewed.  Constitutional:      General: He is not in acute distress.    Appearance: He is well-developed.  HENT:     Head: Normocephalic and atraumatic.  Eyes:     Conjunctiva/sclera: Conjunctivae normal.  Cardiovascular:     Rate and Rhythm: Normal rate and regular rhythm.  Pulmonary:     Effort: Pulmonary effort is normal. No respiratory distress.     Breath sounds: Normal breath sounds. No wheezing, rhonchi or rales.  Abdominal:     Palpations: Abdomen is soft.     Tenderness: There is abdominal tenderness in the left lower quadrant. There is no right CVA  tenderness or left CVA tenderness.  Musculoskeletal:        General: No swelling.     Cervical back: Neck supple.  Skin:    General: Skin is warm and dry.     Capillary Refill: Capillary refill takes less than 2 seconds.  Neurological:     Mental Status: He is alert.  Psychiatric:        Mood and Affect: Mood normal.     ED Results / Procedures / Treatments   Labs (all labs ordered are listed, but only abnormal results are displayed) Labs Reviewed  URINALYSIS, ROUTINE W REFLEX MICROSCOPIC - Abnormal; Notable for the following components:      Result Value   Color, Urine COLORLESS (*)    Glucose, UA >1,000 (*)    Hgb urine dipstick TRACE (*)    All other components  within normal limits  BASIC METABOLIC PANEL - Abnormal; Notable for the following components:   Glucose, Bld 154 (*)    All other components within normal limits  HEPATIC FUNCTION PANEL - Abnormal; Notable for the following components:   Total Protein 8.2 (*)    All other components within normal limits  CBC    EKG None  Radiology CT ABDOMEN PELVIS W CONTRAST Result Date: 05/06/2023 CLINICAL DATA:  Left lower quadrant pain beginning this morning. Nephrolithiasis. EXAM: CT ABDOMEN AND PELVIS WITH CONTRAST TECHNIQUE: Multidetector CT imaging of the abdomen and pelvis was performed using the standard protocol following bolus administration of intravenous contrast. RADIATION DOSE REDUCTION: This exam was performed according to the departmental dose-optimization program which includes automated exposure control, adjustment of the mA and/or kV according to patient size and/or use of iterative reconstruction technique. CONTRAST:  OMNIPAQUE IOHEXOL 300 MG/ML  SOLN COMPARISON:  01/21/2018 FINDINGS: Lower Chest: No acute findings. Hepatobiliary: No suspicious hepatic masses identified. Tiny calcified gallstone seen in the neck, without signs of cholecystitis or biliary ductal dilatation. Pancreas:  No mass or inflammatory changes. Spleen: Within normal limits in size and appearance. Adrenals/Urinary Tract: No suspicious masses identified. Several tiny less than 5 mm left renal calculi are seen. 3 mm calculus is seen in the bladder, which may represent a recently passed ureteral calculus. No evidence of ureteral calculi or hydronephrosis. Stomach/Bowel: No evidence of obstruction, inflammatory process or abnormal fluid collections. Vascular/Lymphatic: No pathologically enlarged lymph nodes. No acute vascular findings. Reproductive:  No mass or other significant abnormality. Other:  None. Musculoskeletal:  No suspicious bone lesions identified. IMPRESSION: 3 mm bladder calculus, which may represent a  recently passed ureteral calculus. No evidence of ureteral calculi or hydronephrosis. Tiny left renal calculi. Cholelithiasis. No radiographic evidence of cholecystitis. Electronically Signed   By: Danae Orleans M.D.   On: 05/06/2023 16:12    Procedures Procedures    Medications Ordered in ED Medications  iohexol (OMNIPAQUE) 300 MG/ML solution 100 mL (100 mLs Intravenous Contrast Given 05/06/23 1519)    ED Course/ Medical Decision Making/ A&P                                 Medical Decision Making Amount and/or Complexity of Data Reviewed Labs: ordered. Radiology: ordered.  Risk Prescription drug management.   This patient presents to the ED for concern of abdominal pain, this involves an extensive number of treatment options, and is a complaint that carries with it a high risk of complications and morbidity.  The differential diagnosis includes gastritis,  PUD, cholecystitis, CBD pathology, SBO/LBO, volvulus, diverticulitis, sinusitis, plantar fasciitis, nephrolithiasis, cystitis, other   Co morbidities that complicate the patient evaluation  See HPI   Additional history obtained:  Additional history obtained from EMR External records from outside source obtained and reviewed including hospital records   Lab Tests:  I Ordered, and personally interpreted labs.  The pertinent results include: No leukocytosis.  No evidence of anemia.  Platelets within range.  No Electra abnormalities.  No renal dysfunction.  UA 6-10 RBCs with trace hemoglobin, greater than thousand glucose.Marland Kitchen  No liver dysfunction   Imaging Studies ordered:  I ordered imaging studies including CT abdomen pelvis I independently visualized and interpreted imaging which showed 3 mm bladder calculus.  No evidence of ureteral calculi.  Tiny left renal calculi.  Cholelithiasis. I agree with the radiologist interpretation  Cardiac Monitoring: / EKG:  The patient was maintained on a cardiac monitor.  I  personally viewed and interpreted the cardiac monitored which showed an underlying rhythm of: Sinus rhythm   Consultations Obtained:  N/a   Problem List / ED Course / Critical interventions / Medication management  Left-sided abdominal pain Reevaluation of the patient  showed that the patient stayed the same I have reviewed the patients home medicines and have made adjustments as needed   Social Determinants of Health:  Denies tobacco, illicit drug use   Test / Admission - Considered:  Left-sided abdominal pain Vitals signs within normal range and stable throughout visit. Laboratory/imaging studies significant for: See above 58 year old male presents emergency department complaints of left-sided abdominal pain.  Patient's pain began this morning.  On exam, left lower quadrant abdominal tenderness without appreciable CVA tenderness.  Labs reassuring.  CT imaging concerning for recently passed ureteral calculi with 3 mm stone in bladder with punctate bilateral stones still remaining in kidney.  Suspect the patient symptoms likely secondary to recently passed ureterolithiasis.  Patient noted resolution of pain between arrival at urgent care prior to ED visit when he took some Tylenol with no recurrence since then.  Will recommend follow-up with urology outpatient setting given history of kidney stones, current assumed passed kidney stone with multiple stones still remaining in the kidneys.  Treatment plan discussed at length with patient and he acknowledged understanding was agreeable to said plan.  Patient overall well-appearing, afebrile in no acute distress. Worrisome signs and symptoms were discussed with the patient, and the patient acknowledged understanding to return to the ED if noticed. Patient was stable upon discharge.          Final Clinical Impression(s) / ED Diagnoses Final diagnoses:  Left lateral abdominal pain    Rx / DC Orders ED Discharge Orders     None          Peter Garter, [place] 05/06/23 1639    Loetta Rough, MD 05/07/23 2202

## 2023-05-06 NOTE — ED Triage Notes (Signed)
C/o LLQ pain starting this morning. Hx of kidney stones.  Denies any other symptoms.

## 2023-05-06 NOTE — Discharge Instructions (Addendum)
As discussed, CT scan did show stones in the bladder which appears to be recently passed stone.  You do have small stones within the left kidney itself that do not seem to be causing obstruction at this time.  Will recommend continue treatment of pain at home with Tylenol/ibuprofen as well as follow-up with urology in the outpatient setting.  Attached is number to call to schedule an appointment.  Please do not hesitate to return to the emergency department if the worrisome signs and symptoms we discussed become apparent.

## 2023-05-06 NOTE — ED Notes (Signed)
Reviewed AVS/discharge instruction with patient. Time allotted for and all questions answered. Patient is agreeable for d/c and escorted to ed exit by staff.  

## 2023-05-06 NOTE — ED Notes (Signed)
Patient transported to CT 

## 2023-05-07 DIAGNOSIS — R109 Unspecified abdominal pain: Secondary | ICD-10-CM | POA: Diagnosis not present

## 2023-06-27 ENCOUNTER — Ambulatory Visit: Payer: BC Managed Care – PPO | Admitting: Cardiology

## 2023-06-27 NOTE — Progress Notes (Deleted)
 Cardiology Office Note:    Date:  06/27/2023   ID:  Nathaniel Conley, DOB 1965/12/23, MRN [id number]  PCP:  Emilio Aspen, MD  Cardiologist:  None  Electrophysiologist:  None   Referring MD: Emilio Aspen, *   No chief complaint on file.   History of Present Illness:    Nathaniel Conley is a 58 y.o. male with a hx of T2DM, hyperlipidemia, hypertension, OSA who presents for follow-up.  He was referred by Dr. Valentina Lucks for evaluation of CAD, initially seen on 07/05/2020.  Calcium score on 06/17/2020 was 963 (98th percentile).  Also noted to have dilated ascending aorta measuring 40 mm.  He does report occasional chest pain.  Describes as dull aching pain in center of his chest, occurs when he is under significant stress.  Occurs about once per month.  Has not noted any exertional chest pain.  He denies any dyspnea.  Reports chest pain typically last for 5 to 10 minutes.  Has had some lightheadedness recently with dietary changes, denies any syncope.  Has chronic headaches.  He denies any palpitations or lower extremity edema.  Does not exercise, has been limited by hip pain.  No smoking history.  Family history includes paternal grandfather died of MI at age 55 and father had MI at age 35.  Lexiscan Myoview on 07/15/2020 showed probably normal perfusion with mild diaphragmatic attenuation, no evidence of ischemia, LVEF 46%.  Echocardiogram on 07/22/2020 showed prominent trabeculations, consider noncompaction, LVEF 50 to 55%, normal RV function, no significant valvular disease.  Cardiac MRI on 10/12/2020 showed mild LV dilatation with mild systolic dysfunction (EF 48%), LV hypertrabeculation consistent with LV noncompaction, no LGE.  Zio patch x7 days on 03/02/2021 showed no significant arrhythmias.  Echocardiogram 04/19/2023 showed EF 50 to 55%, grade 2 diastolic dysfunction, normal RV function, no significant valvular disease.  Since last clinic visit,  he reports he is doing well.  Reports  rare chest pain lasting for few seconds.  He has lost 65 pounds over the last year.  He walks twice per day for 20 minutes each time as well as lifts weights.  Denies any dyspnea, lightheadedness, syncope, lower extremity edema, or palpitations.   Wt Readings from Last 3 Encounters:  05/06/23 200 lb (90.7 kg)  10/08/22 186 lb 12.8 oz (84.7 kg)  04/02/22 223 lb 12.8 oz (101.5 kg)   '  Past Medical History:  Diagnosis Date   Chronic headaches    " 24-7" headache Pt. stated.   Diabetes mellitus without complication (HCC)    High cholesterol    History of kidney stones    Hypertension    Sleep apnea    uses bipap    Past Surgical History:  Procedure Laterality Date   EXTRACORPOREAL SHOCK WAVE LITHOTRIPSY Right 01/27/2018   Procedure: RIGHT EXTRACORPOREAL SHOCK WAVE LITHOTRIPSY (ESWL);  Surgeon: Nathaniel Gully, MD;  Location: [facility];  Service: Urology;  Laterality: Right;   WISDOM TOOTH EXTRACTION     age [age]    Current Medications: No outpatient medications have been marked as taking for the 06/27/23 encounter (Appointment) with Little Ishikawa, MD.     Allergies:   Patient has no known allergies.   Social History   Socioeconomic History   Marital status: Married    Spouse name: Not on file   Number of children: Not on file   Years of education: Not on file   Highest education level: Not on file  Occupational History  Not on file  Tobacco Use   Smoking status: Never   Smokeless tobacco: Never  Substance and Sexual Activity   Alcohol use: No    Alcohol/week: 0.0 standard drinks of alcohol   Drug use: No   Sexual activity: Not on file  Other Topics Concern   Not on file  Social History Narrative   Not on file   Social Drivers of Health   Financial Resource Strain: Not on file  Food Insecurity: Not on file  Transportation Needs: Not on file  Physical Activity: Not on file  Stress: Not on file  Social Connections: Not on file     Family  History: Paternal grandfather MI at 26, father MI at age 25  ROS:   Please see the history of present illness.     All other systems reviewed and are negative.  EKGs/Labs/Other Studies Reviewed:    The following studies were reviewed today:   EKG:   04/02/22: NSR, rate 72, TWI in leads V4-6 10/08/22: NSR, rate 65, nonspecific T wave flattening  Recent Labs: 05/06/2023: ALT 22; BUN 11; Creatinine, Ser 0.88; Hemoglobin 15.3; Platelets 217; Potassium 4.7; Sodium 139  Recent Lipid Panel    Component Value Date/Time   CHOL 85 (L) 04/02/2022 0918   TRIG 88 04/02/2022 0918   HDL 30 (L) 04/02/2022 0918   CHOLHDL 2.8 04/02/2022 0918   LDLCALC 37 04/02/2022 0918    Physical Exam:    VS:  There were no vitals taken for this visit.    Wt Readings from Last 3 Encounters:  05/06/23 200 lb (90.7 kg)  10/08/22 186 lb 12.8 oz (84.7 kg)  04/02/22 223 lb 12.8 oz (101.5 kg)     GEN: Well nourished, well developed in no acute distress HEENT: Normal NECK: No JVD; No carotid bruits LYMPHATICS: No lymphadenopathy CARDIAC: RRR, no murmurs, rubs, gallops RESPIRATORY:  Clear to auscultation without rales, wheezing or rhonchi  ABDOMEN: Soft, non-tender, non-distended MUSCULOSKELETAL:  Trace edema; No deformity  SKIN: Warm and dry NEUROLOGIC:  Alert and oriented x 3 PSYCHIATRIC:  Normal affect   ASSESSMENT:    No diagnosis found.   PLAN:    CAD: Calcium score on 06/17/2020 was 963 (98th percentile).  Reports atypical chest pain.  Lexiscan Myoview on 07/15/2020 showed probably normal perfusion with mild diaphragmatic attenuation, no evidence of ischemia, LVEF 46%.  Echocardiogram on 07/22/2020 showed prominent trabeculations, consider noncompaction, LVEF 50 to 55%, normal RV function, no significant valvular disease. -Continue aspirin 81 mg daily -Continue atorvastatin 80 mg daily  LV noncompaction cardiomyopathy: Echocardiogram on 07/22/2020 showed prominent trabeculations, consider  noncompaction, LVEF 50 to 55%, normal RV function, no significant valvular disease.  Cardiac MRI on 10/12/2020 showed mild LV dilatation with mild systolic dysfunction (EF 48%), LV hypertrabeculation consistent with LV noncompaction, no LGE.  Echocardiogram 04/19/2023 showed EF 50 to 55%, grade 2 diastolic dysfunction, normal RV function, no significant valvular disease. -Continue telmisartan 80 mg daily -Continue carvedilol 12.5 mg twice daily -Continue Farxiga -Have discussed genetic testing, he is not interested  -Zio patch x7 days on 03/02/2021 showed no significant arrhythmias.  Hyperlipidemia: On atorvastatin 80 mg daily.  LDL 76 on 06/09/2021, above goal LDL less than 70.  He has since lost 65 lbs, LDL down to 36 04/17/22  Hypertension: On telmisartan and Coreg 12.5 mg twice daily.  Appears controlled  Aortic dilatation: Dilated ascending aorta measuring 40 mm on calcium score on 06/17/2020.  CTA chest 10/2021 showed normal caliber aorta  T2DM: on Ozempic and [place].  A1c 9.0% 03/02/2022. Down to 5.9% on 08/20/22  OSA: on Bipap, using nightly.  Suspect OSA has improved with his weight loss, encouraged him to follow-up with sleep medicine  Obesity: There is no height or weight on file to calculate BMI.  On Ozempic, has lost 65 pounds in one year  RTC in 6 months***     Medication Adjustments/Labs and Tests Ordered: Current medicines are reviewed at length with the patient today.  Concerns regarding medicines are outlined above.  No orders of the defined types were placed in this encounter.   No orders of the defined types were placed in this encounter.    There are no Patient Instructions on file for this visit.   Signed, Little Ishikawa, MD  06/27/2023 6:41 AM    [facility]

## 2023-07-15 DIAGNOSIS — E1142 Type 2 diabetes mellitus with diabetic polyneuropathy: Secondary | ICD-10-CM | POA: Diagnosis not present

## 2023-07-15 DIAGNOSIS — I700 Atherosclerosis of aorta: Secondary | ICD-10-CM | POA: Diagnosis not present

## 2023-07-15 DIAGNOSIS — E118 Type 2 diabetes mellitus with unspecified complications: Secondary | ICD-10-CM | POA: Diagnosis not present

## 2023-07-15 DIAGNOSIS — I25118 Atherosclerotic heart disease of native coronary artery with other forms of angina pectoris: Secondary | ICD-10-CM | POA: Diagnosis not present

## 2023-08-23 ENCOUNTER — Encounter: Payer: Self-pay | Admitting: Cardiology

## 2023-08-23 ENCOUNTER — Ambulatory Visit: Attending: Cardiology | Admitting: Cardiology

## 2023-08-23 VITALS — BP 114/70 | HR 69 | Ht 69.0 in | Wt 211.0 lb

## 2023-08-23 DIAGNOSIS — I2510 Atherosclerotic heart disease of native coronary artery without angina pectoris: Secondary | ICD-10-CM | POA: Diagnosis not present

## 2023-08-23 DIAGNOSIS — I10 Essential (primary) hypertension: Secondary | ICD-10-CM

## 2023-08-23 DIAGNOSIS — E785 Hyperlipidemia, unspecified: Secondary | ICD-10-CM

## 2023-08-23 DIAGNOSIS — I428 Other cardiomyopathies: Secondary | ICD-10-CM

## 2023-08-23 DIAGNOSIS — I77819 Aortic ectasia, unspecified site: Secondary | ICD-10-CM

## 2023-08-23 NOTE — Progress Notes (Signed)
 Cardiology Office Note:    Date:  08/23/2023   ID:  Nathaniel Conley, DOB 10/20/1965, MRN [id number]  PCP:  Benedetta Bradley, MD  Cardiologist:  None  Electrophysiologist:  None   Referring MD: Benedetta Bradley, *   Chief Complaint  Patient presents with   Coronary Artery Disease    History of Present Illness:    Nathaniel Conley is a 58 y.o. male with a hx of T2DM, hyperlipidemia, hypertension, OSA who presents for follow-up.  He was referred by Dr. Manfred Seed for evaluation of CAD, initially seen on 07/05/2020.  Calcium score on 06/17/2020 was 963 (98th percentile).  Also noted to have dilated ascending aorta measuring 40 mm.  He does report occasional chest pain.  Describes as dull aching pain in center of his chest, occurs when he is under significant stress.  Occurs about once per month.  Has not noted any exertional chest pain.  He denies any dyspnea.  Reports chest pain typically last for 5 to 10 minutes.  Has had some lightheadedness recently with dietary changes, denies any syncope.  Has chronic headaches.  He denies any palpitations or lower extremity edema.  Does not exercise, has been limited by hip pain.  No smoking history.  Family history includes paternal grandfather died of MI at age 27 and father had MI at age 75.  Lexiscan  Myoview  on 07/15/2020 showed probably normal perfusion with mild diaphragmatic attenuation, no evidence of ischemia, LVEF 46%.  Echocardiogram on 07/22/2020 showed prominent trabeculations, consider noncompaction, LVEF 50 to 55%, normal RV function, no significant valvular disease.  Cardiac MRI on 10/12/2020 showed mild LV dilatation with mild systolic dysfunction (EF 48%), LV hypertrabeculation consistent with LV noncompaction, no LGE.  Zio patch x7 days on 03/02/2021 showed no significant arrhythmias.  Echocardiogram 04/2023 showed EF 50 to 55%.  Since last clinic visit, he reports he is doing okay.  He has lost over 70 pounds but reports he stopped  exercising and started drinking soft drinks and has gained 30 pounds back.  He started walking again 3 days ago.  Denies any chest pain, dyspnea, lightheadedness, syncope, lower extremity edema, or palpitations.   Wt Readings from Last 3 Encounters:  08/23/23 211 lb (95.7 kg)  05/06/23 200 lb (90.7 kg)  10/08/22 186 lb 12.8 oz (84.7 kg)   '  Past Medical History:  Diagnosis Date   Chronic headaches    " 24-7" headache Pt. stated.   Diabetes mellitus without complication (HCC)    High cholesterol    History of kidney stones    Hypertension    Sleep apnea    uses bipap    Past Surgical History:  Procedure Laterality Date   EXTRACORPOREAL SHOCK WAVE LITHOTRIPSY Right 01/27/2018   Procedure: RIGHT EXTRACORPOREAL SHOCK WAVE LITHOTRIPSY (ESWL);  Surgeon: Ottelin, Mark, MD;  Location: [facility];  Service: Urology;  Laterality: Right;   WISDOM TOOTH EXTRACTION     age [age]    Current Medications: Current Meds  Medication Sig   amLODipine (NORVASC) 5 MG tablet Take 5 mg by mouth daily.   aspirin 81 MG chewable tablet Chew 81 mg by mouth daily.   atorvastatin (LIPITOR) 80 MG tablet Take 80 mg by mouth daily.   carvedilol  (COREG ) 12.5 MG tablet TAKE 1 TABLET BY MOUTH 2 TIMES DAILY.   FARXIGA 5 MG TABS tablet Take 5 mg by mouth daily.   metFORMIN (GLUCOPHAGE) 1000 MG tablet 1,000 mg 2 (two) times daily.   OZEMPIC,  2 MG/DOSE, 8 MG/3ML SOPN Inject into the skin.   telmisartan (MICARDIS) 80 MG tablet Take 80 mg by mouth daily.     Allergies:   Patient has no known allergies.   Social History   Socioeconomic History   Marital status: Married    Spouse name: Not on file   Number of children: Not on file   Years of education: Not on file   Highest education level: Not on file  Occupational History   Not on file  Tobacco Use   Smoking status: Never   Smokeless tobacco: Never  Substance and Sexual Activity   Alcohol use: No    Alcohol/week: 0.0 standard drinks of alcohol   Drug  use: No   Sexual activity: Not on file  Other Topics Concern   Not on file  Social History Narrative   Not on file   Social Drivers of Health   Financial Resource Strain: Not on file  Food Insecurity: Not on file  Transportation Needs: Not on file  Physical Activity: Not on file  Stress: Not on file  Social Connections: Not on file     Family History: Paternal grandfather MI at 44, father MI at age 44  ROS:   Please see the history of present illness.     All other systems reviewed and are negative.  EKGs/Labs/Other Studies Reviewed:    The following studies were reviewed today:   EKG:   04/02/22: NSR, rate 72, TWI in leads V4-6 10/08/22: NSR, rate 65, nonspecific T wave flattening  Recent Labs: 05/06/2023: ALT 22; BUN 11; Creatinine, Ser 0.88; Hemoglobin 15.3; Platelets 217; Potassium 4.7; Sodium 139  Recent Lipid Panel    Component Value Date/Time   CHOL 85 (L) 04/02/2022 0918   TRIG 88 04/02/2022 0918   HDL 30 (L) 04/02/2022 0918   CHOLHDL 2.8 04/02/2022 0918   LDLCALC 37 04/02/2022 0918    Physical Exam:    VS:  BP 114/70   Pulse 69   Ht 5\' 9"  (1.753 m)   Wt 211 lb (95.7 kg)   SpO2 98%   BMI 31.16 kg/m     Wt Readings from Last 3 Encounters:  08/23/23 211 lb (95.7 kg)  05/06/23 200 lb (90.7 kg)  10/08/22 186 lb 12.8 oz (84.7 kg)     GEN: Well nourished, well developed in no acute distress HEENT: Normal NECK: No JVD; No carotid bruits LYMPHATICS: No lymphadenopathy CARDIAC: RRR, no murmurs, rubs, gallops RESPIRATORY:  Clear to auscultation without rales, wheezing or rhonchi  ABDOMEN: Soft, non-tender, non-distended MUSCULOSKELETAL:  Trace edema; No deformity  SKIN: Warm and dry NEUROLOGIC:  Alert and oriented x 3 PSYCHIATRIC:  Normal affect   ASSESSMENT:    1. Coronary artery disease involving native coronary artery of native heart without angina pectoris   2. Noncompaction cardiomyopathy (HCC)   3. Essential hypertension   4.  Hyperlipidemia, unspecified hyperlipidemia type   5. Aortic dilatation (HCC)      PLAN:    CAD: Calcium score on 06/17/2020 was 963 (98th percentile).  Reports atypical chest pain.  Lexiscan  Myoview  on 07/15/2020 showed probably normal perfusion with mild diaphragmatic attenuation, no evidence of ischemia, LVEF 46%.  Echocardiogram on 07/22/2020 showed prominent trabeculations, consider noncompaction, LVEF 50 to 55%, normal RV function, no significant valvular disease. -Continue aspirin 81 mg daily -Continue atorvastatin 80 mg daily  LV noncompaction cardiomyopathy: Echocardiogram on 07/22/2020 showed prominent trabeculations, consider noncompaction, LVEF 50 to 55%, normal RV function, no significant  valvular disease.  Cardiac MRI on 10/12/2020 showed mild LV dilatation with mild systolic dysfunction (EF 48%), LV hypertrabeculation consistent with LV noncompaction, no LGE.  Echocardiogram 04/2023 showed EF 50 to 55%. -Continue telmisartan 80 mg daily -Continue carvedilol  12.5 mg twice daily -Continue Farxiga -Have discussed genetic testing, he is not interested   Hyperlipidemia: On atorvastatin 80 mg daily.  LDL 76 on 06/09/2021, above goal LDL less than 70.  He has since had significant weight loss, LDL 37 on 04/24/2023  Hypertension: On telmisartan and Coreg  12.5 mg twice daily.  Appears controlled  Aortic dilatation: Dilated ascending aorta measuring 40 mm on calcium score on 06/17/2020.  CTA chest 10/2021 showed normal caliber aorta  T2DM: on Ozempic and Farxiga.  A1c 9.0% 03/02/2022. Down to 6.3% on 04/24/2023  OSA: on Bipap, using nightly.  Suspect OSA has improved with his weight loss, encouraged him to follow-up with sleep medicine  Obesity: Body mass index is 31.16 kg/m.  On Ozempic, lost 70 pounds but reports he stopped exercising and watching his diet and has gained 30 pounds back.  Encouraged diet/exercise  RTC in 6 months     Medication Adjustments/Labs and Tests Ordered: Current  medicines are reviewed at length with the patient today.  Concerns regarding medicines are outlined above.  No orders of the defined types were placed in this encounter.   No orders of the defined types were placed in this encounter.    Patient Instructions  Medication Instructions:  Continue current medications *If you need a refill on your cardiac medications before your next appointment, please call your pharmacy*  Lab Work: none If you have labs (blood work) drawn today and your tests are completely normal, you will receive your results only by: MyChart Message (if you have MyChart) OR A paper copy in the mail If you have any lab test that is abnormal or we need to change your treatment, we will call you to review the results.  Testing/Procedures: none  Follow-Up: At [facility], you and your health needs are our priority.  As part of our continuing mission to provide you with exceptional heart care, our providers are all part of one team.  This team includes your primary Cardiologist (physician) and Advanced Practice Providers or APPs (Physician Assistants and Nurse Practitioners) who all work together to provide you with the care you need, when you need it.  Your next appointment:   6 month(s)  Provider:   Dr. Alda Amas  We recommend signing up for the patient portal called "MyChart".  Sign up information is provided on this After Visit Summary.  MyChart is used to connect with patients for Virtual Visits (Telemedicine).  Patients are able to view lab/test results, encounter notes, upcoming appointments, etc.  Non-urgent messages can be sent to your provider as well.   To learn more about what you can do with MyChart, go to [email or web address].   Other Instructions none       Signed, Wendie Hamburg, MD  08/23/2023 8:38 AM    [facility]

## 2023-08-23 NOTE — Patient Instructions (Signed)
 Medication Instructions:  Continue current medications *If you need a refill on your cardiac medications before your next appointment, please call your pharmacy*  Lab Work: none If you have labs (blood work) drawn today and your tests are completely normal, you will receive your results only by: MyChart Message (if you have MyChart) OR A paper copy in the mail If you have any lab test that is abnormal or we need to change your treatment, we will call you to review the results.  Testing/Procedures: none  Follow-Up: At [facility], you and your health needs are our priority.  As part of our continuing mission to provide you with exceptional heart care, our providers are all part of one team.  This team includes your primary Cardiologist (physician) and Advanced Practice Providers or APPs (Physician Assistants and Nurse Practitioners) who all work together to provide you with the care you need, when you need it.  Your next appointment:   6 month(s)  Provider:   Dr. Alda Amas We recommend signing up for the patient portal called "MyChart".  Sign up information is provided on this After Visit Summary.  MyChart is used to connect with patients for Virtual Visits (Telemedicine).  Patients are able to view lab/test results, encounter notes, upcoming appointments, etc.  Non-urgent messages can be sent to your provider as well.   To learn more about what you can do with MyChart, go to [email or web address].   Other Instructions none

## 2023-09-12 DIAGNOSIS — E1165 Type 2 diabetes mellitus with hyperglycemia: Secondary | ICD-10-CM | POA: Diagnosis not present

## 2023-09-12 DIAGNOSIS — E118 Type 2 diabetes mellitus with unspecified complications: Secondary | ICD-10-CM | POA: Diagnosis not present

## 2023-09-12 DIAGNOSIS — I25118 Atherosclerotic heart disease of native coronary artery with other forms of angina pectoris: Secondary | ICD-10-CM | POA: Diagnosis not present

## 2023-09-12 DIAGNOSIS — I10 Essential (primary) hypertension: Secondary | ICD-10-CM | POA: Diagnosis not present

## 2023-09-12 DIAGNOSIS — R229 Localized swelling, mass and lump, unspecified: Secondary | ICD-10-CM | POA: Diagnosis not present

## 2023-10-21 ENCOUNTER — Ambulatory Visit: Admitting: Cardiology

## 2023-11-01 ENCOUNTER — Other Ambulatory Visit: Payer: Self-pay | Admitting: Cardiology

## 2024-02-03 DIAGNOSIS — Z23 Encounter for immunization: Secondary | ICD-10-CM | POA: Diagnosis not present

## 2024-02-03 DIAGNOSIS — I25118 Atherosclerotic heart disease of native coronary artery with other forms of angina pectoris: Secondary | ICD-10-CM | POA: Diagnosis not present

## 2024-02-03 DIAGNOSIS — E1142 Type 2 diabetes mellitus with diabetic polyneuropathy: Secondary | ICD-10-CM | POA: Diagnosis not present

## 2024-02-03 DIAGNOSIS — I10 Essential (primary) hypertension: Secondary | ICD-10-CM | POA: Diagnosis not present

## 2024-02-03 DIAGNOSIS — E118 Type 2 diabetes mellitus with unspecified complications: Secondary | ICD-10-CM | POA: Diagnosis not present

## 2024-02-24 NOTE — Progress Notes (Unsigned)
 Cardiology Office Note:    Date:  02/28/2024   ID:  Nathaniel Conley, DOB 1965-06-03, MRN [id number]  PCP:  Charlott Dorn LABOR, MD  Cardiologist:  None  Electrophysiologist:  None   Referring MD: Charlott Dorn LABOR, *   Chief Complaint  Patient presents with   Coronary Artery Disease    History of Present Illness:    Nathaniel Conley is a 58 y.o. male with a hx of T2DM, hyperlipidemia, hypertension, OSA who presents for follow-up.  He was referred by Dr. Signa for evaluation of CAD, initially seen on 07/05/2020.  Calcium score on 06/17/2020 was 963 (98th percentile).  Also noted to have dilated ascending aorta measuring 40 mm.  He does report occasional chest pain.  Describes as dull aching pain in center of his chest, occurs when he is under significant stress.  Occurs about once per month.  Has not noted any exertional chest pain.  He denies any dyspnea.  Reports chest pain typically last for 5 to 10 minutes.  Has had some lightheadedness recently with dietary changes, denies any syncope.  Has chronic headaches.  He denies any palpitations or lower extremity edema.  Does not exercise, has been limited by hip pain.  No smoking history.  Family history includes paternal grandfather died of MI at age 69 and father had MI at age 82.  Lexiscan  Myoview  on 07/15/2020 showed probably normal perfusion with mild diaphragmatic attenuation, no evidence of ischemia, LVEF 46%.  Echocardiogram on 07/22/2020 showed prominent trabeculations, consider noncompaction, LVEF 50 to 55%, normal RV function, no significant valvular disease.  Cardiac MRI on 10/12/2020 showed mild LV dilatation with mild systolic dysfunction (EF 48%), LV hypertrabeculation consistent with LV noncompaction, no LGE.  Zio patch x7 days on 03/02/2021 showed no significant arrhythmias.  Echocardiogram 04/2023 showed EF 50 to 55%.  Since last clinic visit, he reports he has been doing okay.  Does report slight discomfort in chest recently.   Lasts for 2 minutes or so.  No clear relationship with exertion.  He is walking 5 days/week for 25 minutes. Denies any dyspnea, lightheadedness, syncope, lower extremity edema, or palpitations.    Wt Readings from Last 3 Encounters:  02/28/24 215 lb (97.5 kg)  08/23/23 211 lb (95.7 kg)  05/06/23 200 lb (90.7 kg)   '  Past Medical History:  Diagnosis Date   Chronic headaches     24-7 headache Pt. stated.   Diabetes mellitus without complication (HCC)    High cholesterol    History of kidney stones    Hypertension    Sleep apnea    uses bipap    Past Surgical History:  Procedure Laterality Date   EXTRACORPOREAL SHOCK WAVE LITHOTRIPSY Right 01/27/2018   Procedure: RIGHT EXTRACORPOREAL SHOCK WAVE LITHOTRIPSY (ESWL);  Surgeon: Ottelin, Mark, MD;  Location: [facility];  Service: Urology;  Laterality: Right;   WISDOM TOOTH EXTRACTION     age [age]    Current Medications: Current Meds  Medication Sig   amLODipine (NORVASC) 5 MG tablet Take 5 mg by mouth daily.   aspirin 81 MG chewable tablet Chew 81 mg by mouth daily.   atorvastatin (LIPITOR) 80 MG tablet Take 80 mg by mouth daily.   carvedilol  (COREG ) 12.5 MG tablet TAKE 1 TABLET BY MOUTH TWICE A DAY   FARXIGA 5 MG TABS tablet Take 5 mg by mouth daily.   metFORMIN (GLUCOPHAGE) 1000 MG tablet 1,000 mg 2 (two) times daily.   OZEMPIC, 2 MG/DOSE, 8 MG/3ML  SOPN Inject into the skin.   sertraline (ZOLOFT) 50 MG tablet 1 tablet by mouth Once a day; Duration: 30 days   telmisartan (MICARDIS) 80 MG tablet Take 80 mg by mouth daily.   TRULICITY 3 MG/0.5ML SOPN      Allergies:   Patient has no known allergies.   Social History   Socioeconomic History   Marital status: Married    Spouse name: Not on file   Number of children: Not on file   Years of education: Not on file   Highest education level: Not on file  Occupational History   Not on file  Tobacco Use   Smoking status: Never   Smokeless tobacco: Never  Substance and  Sexual Activity   Alcohol use: No    Alcohol/week: 0.0 standard drinks of alcohol   Drug use: No   Sexual activity: Not on file  Other Topics Concern   Not on file  Social History Narrative   Not on file   Social Drivers of Health   Financial Resource Strain: Not on file  Food Insecurity: Not on file  Transportation Needs: Not on file  Physical Activity: Not on file  Stress: Not on file  Social Connections: Not on file     Family History: Paternal grandfather MI at 20, father MI at age 72  ROS:   Please see the history of present illness.     All other systems reviewed and are negative.  EKGs/Labs/Other Studies Reviewed:    The following studies were reviewed today:   EKG:   04/02/22: NSR, rate 72, TWI in leads V4-6 10/08/22: NSR, rate 65, nonspecific T wave flattening 02/28/24: NSR, rate 60, nonspecific T wave flattening  Recent Labs: 05/06/2023: ALT 22; BUN 11; Creatinine, Ser 0.88; Hemoglobin 15.3; Platelets 217; Potassium 4.7; Sodium 139  Recent Lipid Panel    Component Value Date/Time   CHOL 85 (L) 04/02/2022 0918   TRIG 88 04/02/2022 0918   HDL 30 (L) 04/02/2022 0918   CHOLHDL 2.8 04/02/2022 0918   LDLCALC 37 04/02/2022 0918    Physical Exam:    VS:  BP 110/66 (BP Location: Left Arm, Patient Position: Sitting, Cuff Size: Normal)   Pulse 60   Ht 5' 9 (1.753 m)   Wt 215 lb (97.5 kg)   SpO2 94%   BMI 31.75 kg/m     Wt Readings from Last 3 Encounters:  02/28/24 215 lb (97.5 kg)  08/23/23 211 lb (95.7 kg)  05/06/23 200 lb (90.7 kg)     GEN: Well nourished, well developed in no acute distress HEENT: Normal NECK: No JVD; No carotid bruits LYMPHATICS: No lymphadenopathy CARDIAC: RRR, no murmurs, rubs, gallops RESPIRATORY:  Clear to auscultation without rales, wheezing or rhonchi  ABDOMEN: Soft, non-tender, non-distended MUSCULOSKELETAL:  Trace edema; No deformity  SKIN: Warm and dry NEUROLOGIC:  Alert and oriented x 3 PSYCHIATRIC:  Normal affect    ASSESSMENT:    1. Coronary artery disease involving native coronary artery of native heart without angina pectoris   2. Chest pain of uncertain etiology   3. Noncompaction cardiomyopathy (HCC)   4. Essential hypertension   5. Hyperlipidemia, unspecified hyperlipidemia type       PLAN:    CAD: Calcium score on 06/17/2020 was 963 (98th percentile).  Reports atypical chest pain.  Lexiscan  Myoview  on 07/15/2020 showed probably normal perfusion with mild diaphragmatic attenuation, no evidence of ischemia, LVEF 46%.  Echocardiogram on 07/22/2020 showed prominent trabeculations, consider noncompaction, LVEF 50 to  55%, normal RV function, no significant valvular disease. -Continue aspirin 81 mg daily -Continue atorvastatin 80 mg daily -He is reporting chest pain recently.  Recommend stress PET to rule out ischemia  LV noncompaction cardiomyopathy: Echocardiogram on 07/22/2020 showed prominent trabeculations, consider noncompaction, LVEF 50 to 55%, normal RV function, no significant valvular disease.  Cardiac MRI on 10/12/2020 showed mild LV dilatation with mild systolic dysfunction (EF 48%), LV hypertrabeculation consistent with LV noncompaction, no LGE.  Echocardiogram 04/2023 showed EF 50 to 55%. -Continue telmisartan 80 mg daily -Continue carvedilol  12.5 mg twice daily -Continue Farxiga -Have discussed genetic testing, he is not interested   Hyperlipidemia: On atorvastatin 80 mg daily.  LDL 76 on 06/09/2021, above goal LDL less than 70.  He has since had significant weight loss, LDL 37 on 04/24/2023  Hypertension: On telmisartan and Coreg  12.5 mg twice daily.  Appears controlled  Aortic dilatation: Dilated ascending aorta measuring 40 mm on calcium score on 06/17/2020.  CTA chest 10/2021 showed normal caliber aorta  T2DM: on Ozempic and Farxiga.  A1c 9.0% 03/02/2022. Down to 6.9% 01/2024  OSA: on Bipap.  Suspect OSA has improved with his weight loss, encouraged him to follow-up with sleep  medicine  Obesity: Body mass index is 31.75 kg/m.  On Ozempic, lost 70 pounds but gained 30 pounds back.  Encouraged diet/exercise  RTC in 6 months   Informed Consent   Shared Decision Making/Informed Consent The risks [chest pain, shortness of breath, cardiac arrhythmias, dizziness, blood pressure fluctuations, myocardial infarction, stroke/transient ischemic attack, nausea, vomiting, allergic reaction, radiation exposure, metallic taste sensation and life-threatening complications (estimated to be 1 in 10,000)], benefits (risk stratification, diagnosing coronary artery disease, treatment guidance) and alternatives of a cardiac PET stress test were discussed in detail with Mr. Steffenhagen and he agrees to proceed.         Medication Adjustments/Labs and Tests Ordered: Current medicines are reviewed at length with the patient today.  Concerns regarding medicines are outlined above.  Orders Placed This Encounter  Procedures   NM PET CT CARDIAC PERFUSION MULTI W/ABSOLUTE BLOODFLOW   Cardiac Stress Test: Informed Consent Details: Physician/Practitioner Attestation; Transcribe to consent form and obtain patient signature   EKG 12-Lead    No orders of the defined types were placed in this encounter.    Patient Instructions  Medication Instructions:  Your physician recommends that you continue on your current medications as directed. Please refer to the Current Medication list given to you today.  *If you need a refill on your cardiac medications before your next appointment, please call your pharmacy*  Lab Work: None  If you have labs (blood work) drawn today and your tests are completely normal, you will receive your results only by: MyChart Message (if you have MyChart) OR A paper copy in the mail If you have any lab test that is abnormal or we need to change your treatment, we will call you to review the results.  Testing/Procedures: Stress Pet Test    Please report to  Radiology at the [facility] Main Entrance 30 minutes early for your test.  [address] [place], [place] [address]                         OR   Please report to Radiology at [facility] Main Entrance, medical mall, 30 mins prior to your test.  [address]  [place], [place]  How to Prepare for  Your Cardiac PET/CT Stress Test:  Nothing to eat or drink, except water, 3 hours prior to arrival time.  NO caffeine/decaffeinated products, or chocolate 12 hours prior to arrival. (Please note decaffeinated beverages (teas/coffees) still contain caffeine).  If you have caffeine within 12 hours prior, the test will need to be rescheduled.  Medication instructions: Do not take erectile dysfunction medications for 72 hours prior to test (sildenafil, tadalafil) Do not take nitrates (isosorbide mononitrate, Ranexa) the day before or day of test Do not take tamsulosin  the day before or morning of test Hold theophylline containing medications for 12 hours. Hold Dipyridamole 48 hours prior to the test.  Diabetic Preparation: If able to eat breakfast prior to 3 hour fasting, you may take all medications, including your insulin. Do not worry if you miss your breakfast dose of insulin - start at your next meal. If you do not eat prior to 3 hour fast-Hold all diabetes (oral and insulin) medications. Patients who wear a continuous glucose monitor MUST remove the device prior to scanning.  You may take your remaining medications with water.  NO perfume, cologne or lotion on chest or abdomen area. FEMALES - Please avoid wearing dresses to this appointment.  Total time is 1 to 2 hours; you may want to bring reading material for the waiting time.  IF YOU THINK YOU MAY BE PREGNANT, OR ARE NURSING PLEASE INFORM THE TECHNOLOGIST.  In preparation for your appointment, medication and supplies will be purchased.  Appointment availability is limited, so if you need to  cancel or reschedule, please call the Radiology Department Scheduler at [phone] 24 hours in advance to avoid a cancellation fee of $100.00  What to Expect When you Arrive:  Once you arrive and check in for your appointment, you will be taken to a preparation room within the Radiology Department.  A technologist or Nurse will obtain your medical history, verify that you are correctly prepped for the exam, and explain the procedure.  Afterwards, an IV will be started in your arm and electrodes will be placed on your skin for EKG monitoring during the stress portion of the exam. Then you will be escorted to the PET/CT scanner.  There, staff will get you positioned on the scanner and obtain a blood pressure and EKG.  During the exam, you will continue to be connected to the EKG and blood pressure machines.  A small, safe amount of a radioactive tracer will be injected in your IV to obtain a series of pictures of your heart along with an injection of a stress agent.    After your Exam:  It is recommended that you eat a meal and drink a caffeinated beverage to counter act any effects of the stress agent.  Drink plenty of fluids for the remainder of the day and urinate frequently for the first couple of hours after the exam.  Your doctor will inform you of your test results within 7-10 business days.  For more information and frequently asked questions, please visit our website: [email or web address]  For questions about your test or how to prepare for your test, please call: Cardiac Imaging Nurse Navigators Office: [phone]   Follow-Up: At [facility], you and your health needs are our priority.  As part of our continuing mission to provide you with exceptional heart care, our providers are all part of one team.  This team includes your primary Cardiologist (physician) and Advanced Practice Providers or APPs (Physician Assistants and Nurse Practitioners) who all  work  together to provide you with the care you need, when you need it.  Your next appointment:   6 month(s)  Provider:   Dr. Lonni Nanas  We recommend signing up for the patient portal called MyChart.  Sign up information is provided on this After Visit Summary.  MyChart is used to connect with patients for Virtual Visits (Telemedicine).  Patients are able to view lab/test results, encounter notes, upcoming appointments, etc.  Non-urgent messages can be sent to your provider as well.   To learn more about what you can do with MyChart, go to [email or web address].   Other Instructions None            Signed, Lonni LITTIE Nanas, MD  02/28/2024 5:45 PM    [facility]

## 2024-02-28 ENCOUNTER — Ambulatory Visit: Attending: Cardiology | Admitting: Cardiology

## 2024-02-28 VITALS — BP 110/66 | HR 60 | Ht 69.0 in | Wt 215.0 lb

## 2024-02-28 DIAGNOSIS — R079 Chest pain, unspecified: Secondary | ICD-10-CM | POA: Diagnosis not present

## 2024-02-28 DIAGNOSIS — I10 Essential (primary) hypertension: Secondary | ICD-10-CM

## 2024-02-28 DIAGNOSIS — I2510 Atherosclerotic heart disease of native coronary artery without angina pectoris: Secondary | ICD-10-CM

## 2024-02-28 DIAGNOSIS — I428 Other cardiomyopathies: Secondary | ICD-10-CM | POA: Diagnosis not present

## 2024-02-28 DIAGNOSIS — E785 Hyperlipidemia, unspecified: Secondary | ICD-10-CM

## 2024-02-28 NOTE — Patient Instructions (Addendum)
 Medication Instructions:  Your physician recommends that you continue on your current medications as directed. Please refer to the Current Medication list given to you today.  *If you need a refill on your cardiac medications before your next appointment, please call your pharmacy*  Lab Work: None  If you have labs (blood work) drawn today and your tests are completely normal, you will receive your results only by: MyChart Message (if you have MyChart) OR A paper copy in the mail If you have any lab test that is abnormal or we need to change your treatment, we will call you to review the results.  Testing/Procedures: Stress Pet Test    Please report to Radiology at the [facility] Main Entrance 30 minutes early for your test.  [address] [place], [place] [address]                         OR   Please report to Radiology at [facility] Main Entrance, medical mall, 30 mins prior to your test.  [address]  [place], [place]  How to Prepare for Your Cardiac PET/CT Stress Test:  Nothing to eat or drink, except water, 3 hours prior to arrival time.  NO caffeine/decaffeinated products, or chocolate 12 hours prior to arrival. (Please note decaffeinated beverages (teas/coffees) still contain caffeine).  If you have caffeine within 12 hours prior, the test will need to be rescheduled.  Medication instructions: Do not take erectile dysfunction medications for 72 hours prior to test (sildenafil, tadalafil) Do not take nitrates (isosorbide mononitrate, Ranexa) the day before or day of test Do not take tamsulosin  the day before or morning of test Hold theophylline containing medications for 12 hours. Hold Dipyridamole 48 hours prior to the test.  Diabetic Preparation: If able to eat breakfast prior to 3 hour fasting, you may take all medications, including your insulin. Do not worry if you miss your breakfast dose of insulin - start at your next  meal. If you do not eat prior to 3 hour fast-Hold all diabetes (oral and insulin) medications. Patients who wear a continuous glucose monitor MUST remove the device prior to scanning.  You may take your remaining medications with water.  NO perfume, cologne or lotion on chest or abdomen area. FEMALES - Please avoid wearing dresses to this appointment.  Total time is 1 to 2 hours; you may want to bring reading material for the waiting time.  IF YOU THINK YOU MAY BE PREGNANT, OR ARE NURSING PLEASE INFORM THE TECHNOLOGIST.  In preparation for your appointment, medication and supplies will be purchased.  Appointment availability is limited, so if you need to cancel or reschedule, please call the Radiology Department Scheduler at [phone] 24 hours in advance to avoid a cancellation fee of $100.00  What to Expect When you Arrive:  Once you arrive and check in for your appointment, you will be taken to a preparation room within the Radiology Department.  A technologist or Nurse will obtain your medical history, verify that you are correctly prepped for the exam, and explain the procedure.  Afterwards, an IV will be started in your arm and electrodes will be placed on your skin for EKG monitoring during the stress portion of the exam. Then you will be escorted to the PET/CT scanner.  There, staff will get you positioned on the scanner and obtain a blood pressure and EKG.  During the exam, you will continue  to be connected to the EKG and blood pressure machines.  A small, safe amount of a radioactive tracer will be injected in your IV to obtain a series of pictures of your heart along with an injection of a stress agent.    After your Exam:  It is recommended that you eat a meal and drink a caffeinated beverage to counter act any effects of the stress agent.  Drink plenty of fluids for the remainder of the day and urinate frequently for the first couple of hours after the exam.  Your doctor will  inform you of your test results within 7-10 business days.  For more information and frequently asked questions, please visit our website: [email or web address]  For questions about your test or how to prepare for your test, please call: Cardiac Imaging Nurse Navigators Office: [phone]   Follow-Up: At [facility], you and your health needs are our priority.  As part of our continuing mission to provide you with exceptional heart care, our providers are all part of one team.  This team includes your primary Cardiologist (physician) and Advanced Practice Providers or APPs (Physician Assistants and Nurse Practitioners) who all work together to provide you with the care you need, when you need it.  Your next appointment:   6 month(s)  Provider:   Dr. Lonni Nanas  We recommend signing up for the patient portal called MyChart.  Sign up information is provided on this After Visit Summary.  MyChart is used to connect with patients for Virtual Visits (Telemedicine).  Patients are able to view lab/test results, encounter notes, upcoming appointments, etc.  Non-urgent messages can be sent to your provider as well.   To learn more about what you can do with MyChart, go to [email or web address].   Other Instructions None

## 2024-03-10 ENCOUNTER — Telehealth (HOSPITAL_COMMUNITY): Payer: Self-pay | Admitting: *Deleted

## 2024-03-10 NOTE — Telephone Encounter (Signed)
 Reaching out to patient to offer assistance regarding upcoming cardiac imaging study; pt verbalizes understanding of appt date/time, parking situation and where to check in, pre-test NPO status, and verified current allergies; name and call back number provided for further questions should they arise  Chantal Requena RN Navigator Cardiac Imaging [facility] [phone] office [phone] cell  Patient is aware to avoid caffeine 12 hours prior to his cardiac PET study.

## 2024-03-11 ENCOUNTER — Other Ambulatory Visit (HOSPITAL_COMMUNITY): Payer: Self-pay | Admitting: Cardiology

## 2024-03-11 ENCOUNTER — Ambulatory Visit (HOSPITAL_COMMUNITY)
Admission: RE | Admit: 2024-03-11 | Discharge: 2024-03-11 | Disposition: A | Discharge status: Home / Self Care | Source: Ambulatory Visit | Attending: Cardiology | Admitting: Cardiology

## 2024-03-11 DIAGNOSIS — R079 Chest pain, unspecified: Secondary | ICD-10-CM

## 2024-03-11 DIAGNOSIS — E119 Type 2 diabetes mellitus without complications: Secondary | ICD-10-CM | POA: Diagnosis not present

## 2024-03-11 DIAGNOSIS — H53143 Visual discomfort, bilateral: Secondary | ICD-10-CM | POA: Diagnosis not present

## 2024-03-11 DIAGNOSIS — I700 Atherosclerosis of aorta: Secondary | ICD-10-CM | POA: Diagnosis not present

## 2024-03-11 LAB — NM PET CT CARDIAC PERFUSION MULTI W/ABSOLUTE BLOODFLOW
LV dias vol: 124 mL (ref 62–150)
LV sys vol: 67 mL (ref 4.2–5.8)
MBFR: 2.31
Nuc Rest EF: 46 %
Nuc Stress EF: 56 %
Peak HR: 87 {beats}/min
Rest HR: 63 {beats}/min
Rest MBF: 0.75 ml/g/min
Rest Nuclear Isotope Dose: 25.8 mCi
ST Depression (mm): 0 mm
Stress MBF: 1.73 ml/g/min
Stress Nuclear Isotope Dose: 25.8 mCi
T Wave inversion (mm): 1 mm

## 2024-03-11 MED ORDER — REGADENOSON 0.4 MG/5ML IV SOLN
0.4000 mg | Freq: Once | INTRAVENOUS | Status: AC
  Administered 2024-03-11: 0.4 mg via INTRAVENOUS

## 2024-03-11 MED ORDER — RUBIDIUM RB82 GENERATOR (RUBYFILL)
25.7000 | PACK | Freq: Once | INTRAVENOUS | Status: AC
  Administered 2024-03-11: 25.7 via INTRAVENOUS

## 2024-03-11 MED ORDER — REGADENOSON 0.4 MG/5ML IV SOLN
INTRAVENOUS | Status: AC
Start: 2024-03-11 — End: 2024-03-11
  Filled 2024-03-11: qty 5

## 2024-03-11 MED ORDER — RUBIDIUM RB82 GENERATOR (RUBYFILL)
25.8200 | PACK | Freq: Once | INTRAVENOUS | Status: AC
  Administered 2024-03-11: 25.82 via INTRAVENOUS

## 2024-03-15 ENCOUNTER — Ambulatory Visit: Payer: Self-pay | Admitting: Cardiology
